# Patient Record
Sex: Male | Born: 1955 | Race: Black or African American | Hispanic: No | Marital: Married | Smoking: Never smoker
Health system: Southern US, Community
[De-identification: ages and names within clinical notes are randomized; demographics above are authoritative.]

## PROBLEM LIST (undated history)

## (undated) DIAGNOSIS — R443 Hallucinations, unspecified: Secondary | ICD-10-CM

## (undated) DIAGNOSIS — F4310 Post-traumatic stress disorder, unspecified: Secondary | ICD-10-CM

## (undated) DIAGNOSIS — E7800 Pure hypercholesterolemia, unspecified: Secondary | ICD-10-CM

## (undated) DIAGNOSIS — I10 Essential (primary) hypertension: Secondary | ICD-10-CM

## (undated) DIAGNOSIS — C801 Malignant (primary) neoplasm, unspecified: Secondary | ICD-10-CM

## (undated) DIAGNOSIS — F32A Depression, unspecified: Secondary | ICD-10-CM

## (undated) DIAGNOSIS — E119 Type 2 diabetes mellitus without complications: Secondary | ICD-10-CM

## (undated) DIAGNOSIS — R45851 Suicidal ideations: Secondary | ICD-10-CM

## (undated) HISTORY — PX: OTHER SURGICAL HISTORY: SHX169

## (undated) HISTORY — PX: COLONOSCOPY: SHX174

## (undated) HISTORY — PX: CORONARY ANGIOPLASTY WITH STENT PLACEMENT: SHX49

## (undated) HISTORY — DX: Depression, unspecified: F32.A

---

## 2001-06-22 ENCOUNTER — Encounter: Payer: Self-pay | Admitting: Emergency Medicine

## 2001-06-22 ENCOUNTER — Inpatient Hospital Stay (HOSPITAL_COMMUNITY): Admission: EM | Admit: 2001-06-22 | Discharge: 2001-06-26 | Payer: Self-pay | Admitting: Emergency Medicine

## 2001-06-22 ENCOUNTER — Encounter (INDEPENDENT_AMBULATORY_CARE_PROVIDER_SITE_OTHER): Payer: Self-pay | Admitting: Specialist

## 2001-07-06 ENCOUNTER — Encounter: Admission: RE | Admit: 2001-07-06 | Discharge: 2001-07-06 | Payer: Self-pay | Admitting: Internal Medicine

## 2001-08-14 ENCOUNTER — Encounter: Admission: RE | Admit: 2001-08-14 | Discharge: 2001-08-14 | Payer: Self-pay

## 2002-02-17 ENCOUNTER — Encounter: Admission: RE | Admit: 2002-02-17 | Discharge: 2002-02-17 | Payer: Self-pay | Admitting: Internal Medicine

## 2002-03-03 ENCOUNTER — Encounter: Admission: RE | Admit: 2002-03-03 | Discharge: 2002-03-03 | Payer: Self-pay | Admitting: Internal Medicine

## 2003-04-15 ENCOUNTER — Emergency Department (HOSPITAL_COMMUNITY): Admission: EM | Admit: 2003-04-15 | Discharge: 2003-04-15 | Payer: Self-pay | Admitting: Emergency Medicine

## 2003-04-18 ENCOUNTER — Emergency Department (HOSPITAL_COMMUNITY): Admission: AD | Admit: 2003-04-18 | Discharge: 2003-04-18 | Payer: Self-pay | Admitting: Family Medicine

## 2003-04-29 ENCOUNTER — Encounter: Admission: RE | Admit: 2003-04-29 | Discharge: 2003-04-29 | Payer: Self-pay | Admitting: Internal Medicine

## 2003-06-24 ENCOUNTER — Ambulatory Visit (HOSPITAL_COMMUNITY): Admission: RE | Admit: 2003-06-24 | Discharge: 2003-06-24 | Payer: Self-pay | Admitting: Internal Medicine

## 2003-06-24 ENCOUNTER — Encounter: Admission: RE | Admit: 2003-06-24 | Discharge: 2003-06-24 | Payer: Self-pay | Admitting: Internal Medicine

## 2004-10-22 ENCOUNTER — Emergency Department (HOSPITAL_COMMUNITY): Admission: EM | Admit: 2004-10-22 | Discharge: 2004-10-22 | Payer: Self-pay | Admitting: Family Medicine

## 2004-11-06 ENCOUNTER — Encounter: Payer: Self-pay | Admitting: Cardiology

## 2004-11-06 ENCOUNTER — Ambulatory Visit: Payer: Self-pay | Admitting: Internal Medicine

## 2004-11-06 ENCOUNTER — Ambulatory Visit: Payer: Self-pay | Admitting: Cardiology

## 2004-11-06 ENCOUNTER — Inpatient Hospital Stay (HOSPITAL_COMMUNITY): Admission: EM | Admit: 2004-11-06 | Discharge: 2004-11-09 | Payer: Self-pay | Admitting: Emergency Medicine

## 2004-11-21 ENCOUNTER — Ambulatory Visit: Payer: Self-pay | Admitting: Internal Medicine

## 2004-12-06 ENCOUNTER — Ambulatory Visit: Payer: Self-pay | Admitting: Internal Medicine

## 2005-04-11 ENCOUNTER — Ambulatory Visit: Payer: Self-pay | Admitting: Internal Medicine

## 2005-04-25 ENCOUNTER — Ambulatory Visit: Payer: Self-pay | Admitting: Internal Medicine

## 2005-05-21 ENCOUNTER — Ambulatory Visit (HOSPITAL_COMMUNITY): Admission: RE | Admit: 2005-05-21 | Discharge: 2005-05-21 | Payer: Self-pay | Admitting: Internal Medicine

## 2005-05-21 ENCOUNTER — Ambulatory Visit: Payer: Self-pay | Admitting: Internal Medicine

## 2005-06-28 ENCOUNTER — Emergency Department (HOSPITAL_COMMUNITY): Admission: EM | Admit: 2005-06-28 | Discharge: 2005-06-28 | Payer: Self-pay | Admitting: *Deleted

## 2005-07-23 ENCOUNTER — Encounter (INDEPENDENT_AMBULATORY_CARE_PROVIDER_SITE_OTHER): Payer: Self-pay | Admitting: *Deleted

## 2005-07-23 ENCOUNTER — Ambulatory Visit: Payer: Self-pay | Admitting: Internal Medicine

## 2005-07-23 DIAGNOSIS — Z87448 Personal history of other diseases of urinary system: Secondary | ICD-10-CM

## 2005-07-23 LAB — CONVERTED CEMR LAB: PSA: 0.67 ng/mL

## 2006-03-27 ENCOUNTER — Emergency Department (HOSPITAL_COMMUNITY): Admission: EM | Admit: 2006-03-27 | Discharge: 2006-03-27 | Payer: Self-pay | Admitting: Family Medicine

## 2006-04-16 ENCOUNTER — Encounter (INDEPENDENT_AMBULATORY_CARE_PROVIDER_SITE_OTHER): Payer: Self-pay | Admitting: *Deleted

## 2006-04-16 DIAGNOSIS — B182 Chronic viral hepatitis C: Secondary | ICD-10-CM | POA: Insufficient documentation

## 2006-04-16 DIAGNOSIS — F1910 Other psychoactive substance abuse, uncomplicated: Secondary | ICD-10-CM

## 2006-04-16 DIAGNOSIS — Z8711 Personal history of peptic ulcer disease: Secondary | ICD-10-CM

## 2006-04-16 DIAGNOSIS — Z8719 Personal history of other diseases of the digestive system: Secondary | ICD-10-CM

## 2006-04-16 DIAGNOSIS — I10 Essential (primary) hypertension: Secondary | ICD-10-CM | POA: Insufficient documentation

## 2006-06-23 DIAGNOSIS — E785 Hyperlipidemia, unspecified: Secondary | ICD-10-CM | POA: Insufficient documentation

## 2006-06-23 DIAGNOSIS — K219 Gastro-esophageal reflux disease without esophagitis: Secondary | ICD-10-CM | POA: Insufficient documentation

## 2006-07-18 ENCOUNTER — Emergency Department (HOSPITAL_COMMUNITY): Admission: EM | Admit: 2006-07-18 | Discharge: 2006-07-18 | Payer: Self-pay | Admitting: Emergency Medicine

## 2006-07-18 ENCOUNTER — Telehealth (INDEPENDENT_AMBULATORY_CARE_PROVIDER_SITE_OTHER): Payer: Self-pay | Admitting: *Deleted

## 2006-08-01 ENCOUNTER — Ambulatory Visit: Payer: Self-pay | Admitting: Internal Medicine

## 2006-08-01 ENCOUNTER — Encounter (INDEPENDENT_AMBULATORY_CARE_PROVIDER_SITE_OTHER): Payer: Self-pay | Admitting: *Deleted

## 2006-08-01 LAB — CONVERTED CEMR LAB
AST: 39 units/L — ABNORMAL HIGH (ref 0–37)
Alkaline Phosphatase: 70 units/L (ref 39–117)
Glucose, Bld: 105 mg/dL — ABNORMAL HIGH (ref 70–99)
INR: 1.2
Sodium: 138 meq/L (ref 135–145)
Total Bilirubin: 0.6 mg/dL (ref 0.3–1.2)
Total Protein: 7.6 g/dL (ref 6.0–8.3)

## 2006-08-08 ENCOUNTER — Ambulatory Visit: Payer: Self-pay | Admitting: Internal Medicine

## 2006-12-16 ENCOUNTER — Emergency Department (HOSPITAL_COMMUNITY): Admission: EM | Admit: 2006-12-16 | Discharge: 2006-12-16 | Payer: Self-pay | Admitting: Family Medicine

## 2006-12-23 ENCOUNTER — Telehealth: Payer: Self-pay | Admitting: *Deleted

## 2007-04-24 ENCOUNTER — Telehealth (INDEPENDENT_AMBULATORY_CARE_PROVIDER_SITE_OTHER): Payer: Self-pay | Admitting: *Deleted

## 2007-09-08 IMAGING — CT CT ANGIO CHEST
2 series · 19 of 32 positions shown · IV contrast (omnipaque)
Comparison: none

CLINICAL DATA: Chest pain left side.  Shortness of breath.  Some leg pain.  
 CT ANGIOGRAPHY OF CHEST:
TECHNIQUE: Multidetector CT imaging of the chest was performed during bolus injection of intravenous contrast.  Multiplanar CT angiographic image reconstructions were generated to evaluate the vascular anatomy.
 Contrast:  100 cc Omnipaque 300.  There was relatively poor timing for pulmonary embolus.  It was therefore reinjected with another 100 milliliters of Omnipaque 300 and on the second scan, there was improved visualization of the pulmonary arteries which showed no evidence of pulmonary embolus.  
 The major vessels appear normal.  Cardiopericardial silhouette is minimally prominent, but no pericardial or pleural effusion is seen.  There appears to be a small hiatal hernia and there is a right apical pleural bleb which measures approximately 1.5 x 2.0 cm.  There is no pneumothorax seen. 
 The bony thorax is within the normal limit.  The gallbladder shows multiple small gallstones.  Common bile duct, hepatic radicles, liver, spleen are normal.  The adrenal glands are normal.  Kidneys are normal with simple cyst measuring up 2.0 cm in diameter associated with the left kidney.

[Series 6: pulm embolism 2.0 b31f st · axial · 0.81mm/px · z∈[-230,+28]mm · 14 of 153 slices shown (1 of 2)]
[im 12/153  lung]
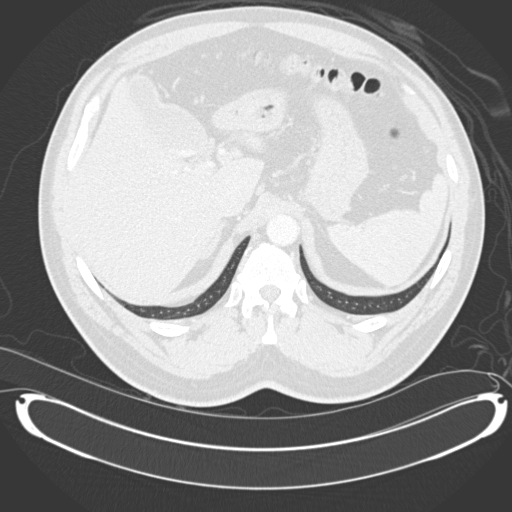
[im 24/153  mediastinal]
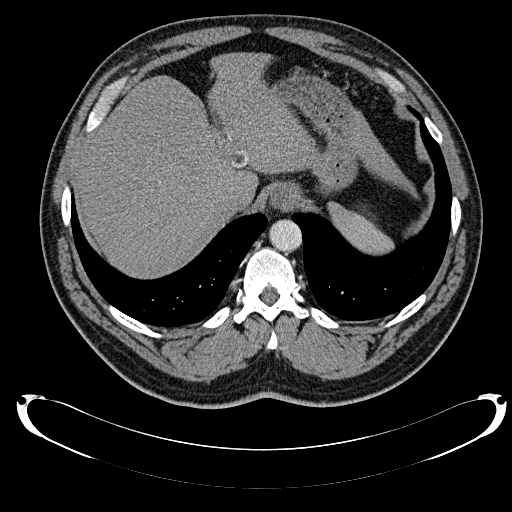
[im 36/153  lung]
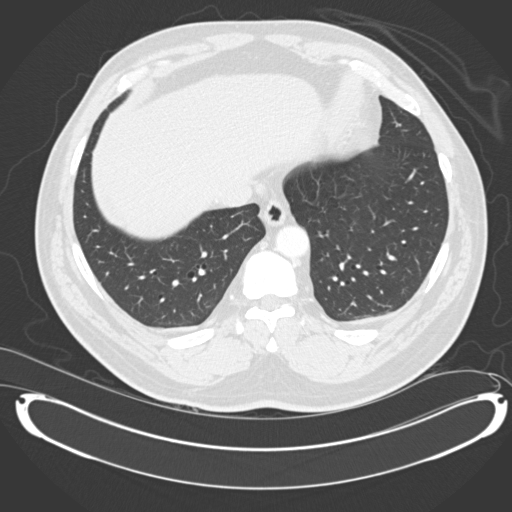
[im 47/153  mediastinal]
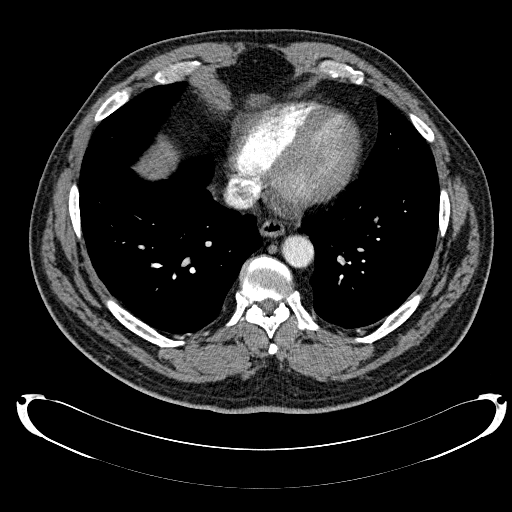
[im 59/153  lung]
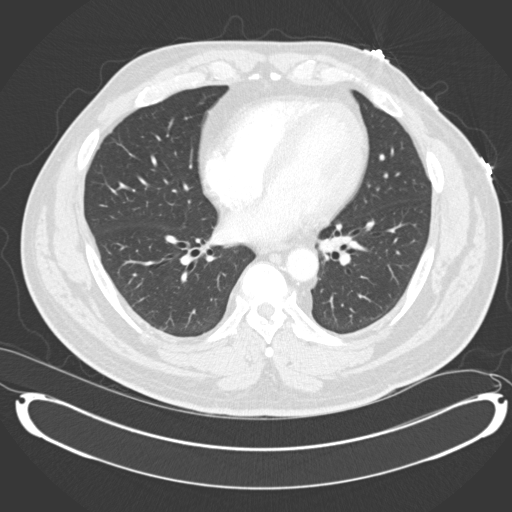
[im 69/153  mediastinal]
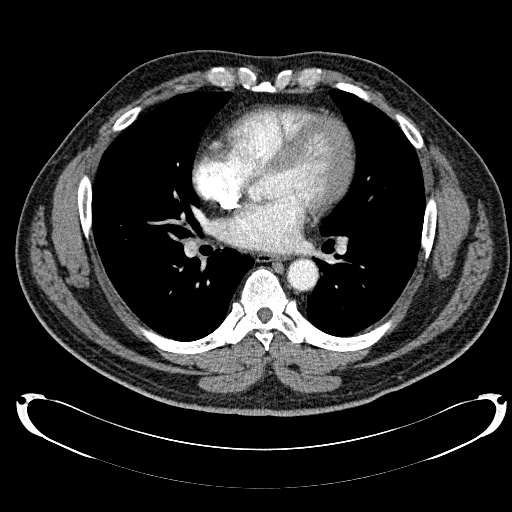
[im 71/153  lung]
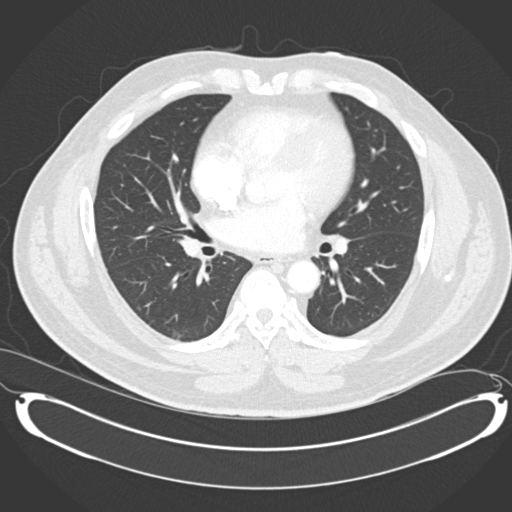
[im 77/153  mediastinal]
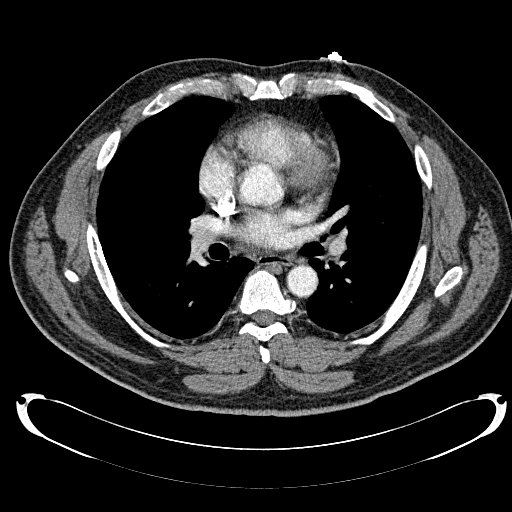
[im 82/153  lung]
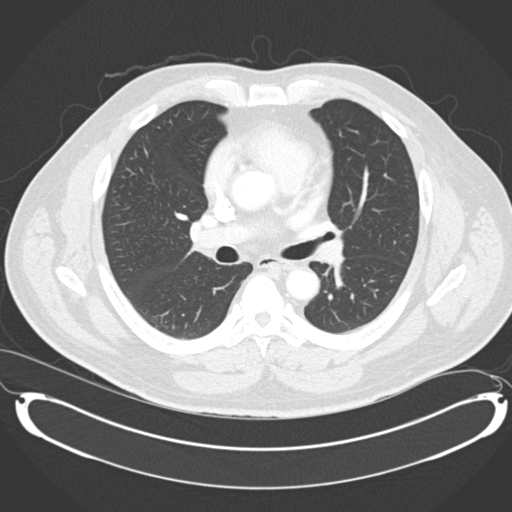
[im 94/153  mediastinal]
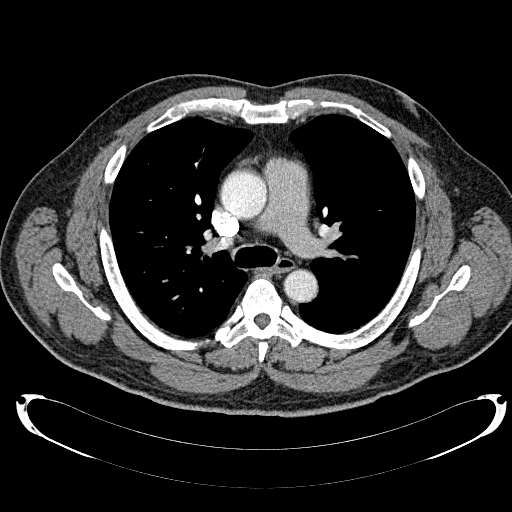
[im 106/153  lung]
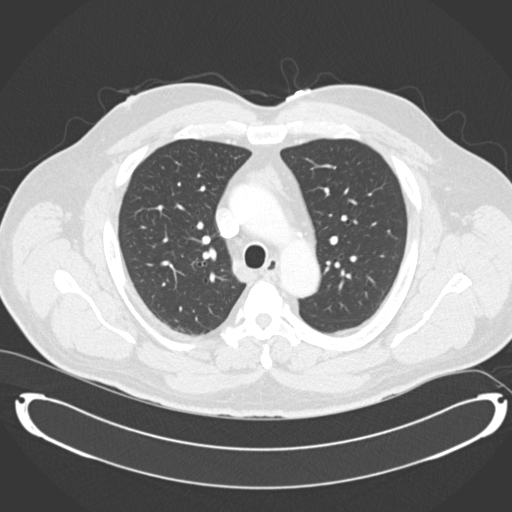
[im 117/153  mediastinal]
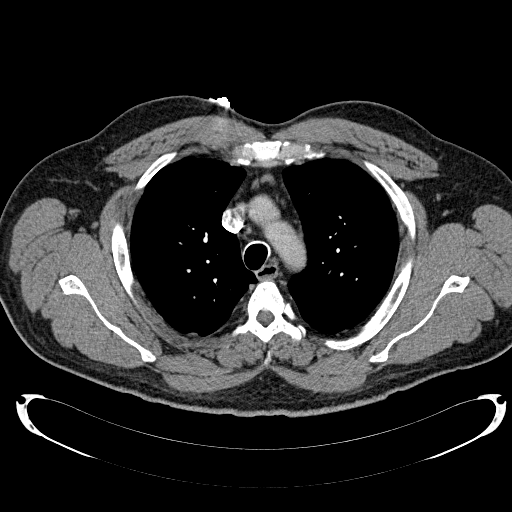
[im 129/153  lung]
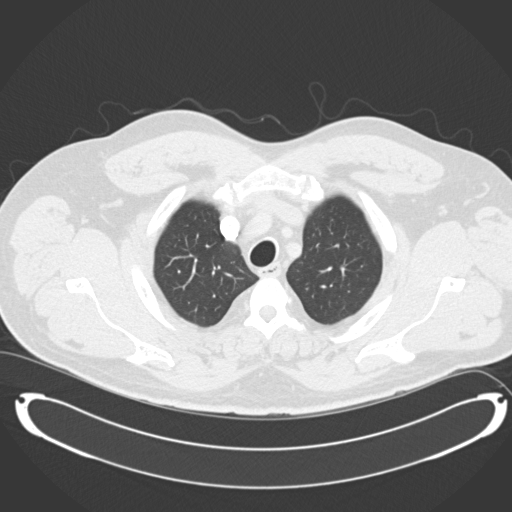
[im 141/153  mediastinal]
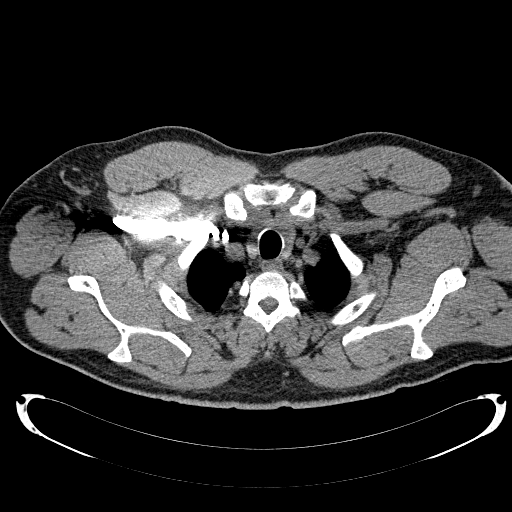

[Series 12: pulm embolism 2.0 b31f st · axial · 0.70mm/px · z∈[-246,-108]mm · 5 of 163 slices shown (2 of 2)]
[im 13/163  lung]
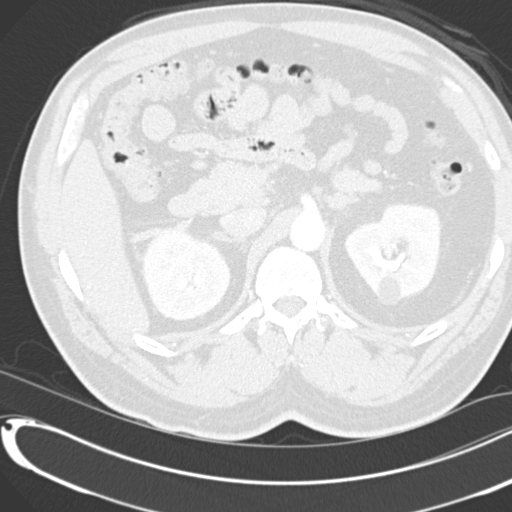
[im 38/163  lung]
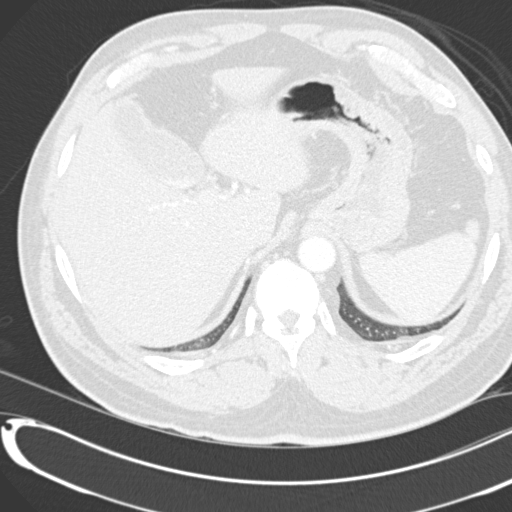
[im 63/163  lung]
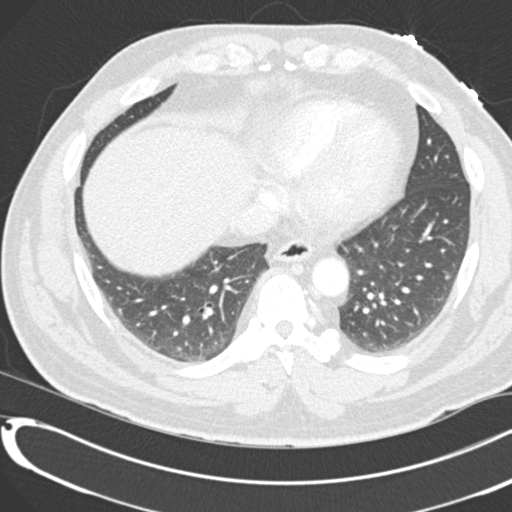
[im 77/163  lung]
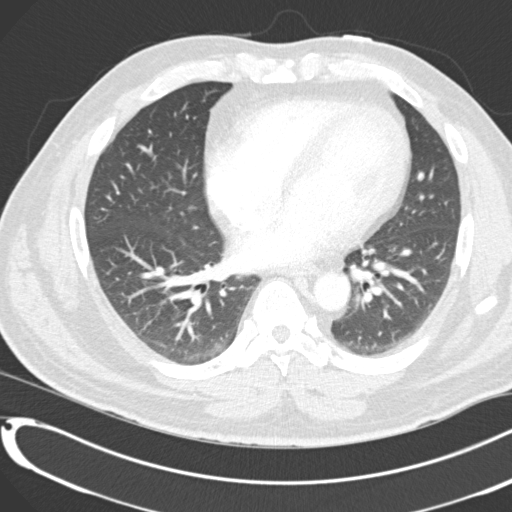
[im 82/163  lung]
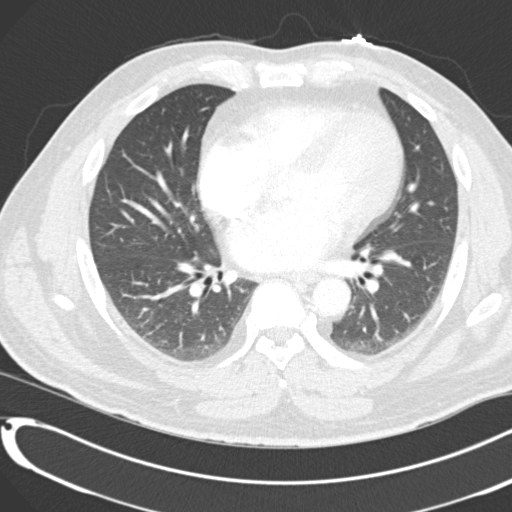

[19 of 32 positions shown; findings below may reference images not displayed]

IMPRESSION: No evidence of pulmonary emboli or acute pulmonary disease.  There is a 1.5 x 2.0 cm right apical pleural bleb, but no pneumothorax or pleural effusion.  No abnormal lymph nodes are seen.  There are no masses present.  Heart is slightly prominent in size with no pericardial effusion.  The gallbladder shows multiple small gallstones.  Simple cyst left kidney.

## 2007-12-07 ENCOUNTER — Telehealth (INDEPENDENT_AMBULATORY_CARE_PROVIDER_SITE_OTHER): Payer: Self-pay | Admitting: *Deleted

## 2010-11-02 NOTE — Consult Note (Signed)
NAMEMETE, PURDUM          ACCOUNT NO.:  [id number]   MEDICAL RECORD NO.:  [id number]          PATIENT TYPE:  INP   LOCATION:  1824                         FACILITY:  [facility]   PHYSICIAN:  Ramiro Harvest, MD    DATE OF BIRTH:  Jul 27, 1955   DATE OF CONSULTATION:  DATE OF DISCHARGE:                                   CONSULTATION   ADDENDUM:  job [phone]   ALLERGIES:  Patient has no known drug allergies.  Patient has no allergies  to contrast.  Patient has no allergies to shellfish.      DT/MEDQ  D:  11/06/2004  T:  11/06/2004  Job:  [id number]

## 2010-11-02 NOTE — Discharge Summary (Signed)
[name], [name]          ACCOUNT NO.:  [id number]   MEDICAL RECORD NO.:  [id number]          PATIENT TYPE:  INP   LOCATION:  3728                         FACILITY:  [facility]   PHYSICIAN:  Darrol Jump, MD        DATE OF BIRTH:  1955-07-17   DATE OF ADMISSION:  11/06/2004  DATE OF DISCHARGE:  11/09/2004                                 DISCHARGE SUMMARY   DISCHARGE DIAGNOSES:  1.  Nonischemic chest pain.  2.  Hyperlipidemia.  3.  Hepatitis C.  4.  History of polysubstance abuse.  5.  Hypertension.  6.  History of cocaine abuse.  7.  History of tobacco abuse.  8.  History of ethanol abuse.  9.  History of viral esophagitis.  10. History of H.pylori infection 2003 with EGD in 2003.   DISCHARGE MEDICATIONS:  1.  Aspirin 81 mg daily.  2.  Lisinopril 10 mg daily.  3.  Hydrochlorothiazide 25 mg daily.   DISPOSITION:  The patient is to be discharged home and is to follow up in  the [facility] outpatient clinic on June 7, at 2:30 p.m.  At that time, he  will need a B-MET.   CONSULTATIONS:  Consultations during this hospitalization:  Vida Roller,  M.D., [facility].   BRIEF ADMISSION HISTORY AND PHYSICAL:  [name] is a 55 year old  African American male with past medical history significant for hypertension  and polysubstance abuse.  He describes substernal chest pain with some left  handed numbness and dyspnea, positive nausea but no vomiting.  This started  at 4 a.m. when he was standing up from [facility] at the ministry.  The patient  says that the pain and pressure worsened and lasted about 20-30 minutes upon  standing.  Pain was relieved with administration of nitroglycerin.  Also,  complains of right ankle edema which is not new.  Also, has a history of  polysubstance abuse and quit using cocaine in December 2005.   PHYSICAL EXAMINATION:  VITAL SIGNS:  Pulse 81, blood pressure 198/107,  repeat 152/96.  He had equal pressures in both arms.  Temperature  97.7,  respirations 20, O2 saturation 100% on room air.  GENERAL APPEARANCE:  This is an overweight African American male who  appeared in no acute distress  HEENT:  Eyes were PERRLA.  Extraocular movements intact with three beat  horizontal nystagmus.  Oropharynx was clear.  No erythema.  No exudate.  Good dentition.  NECK:  Supple.  No JVD.  LUNGS:  Respirations were clear to auscultation bilaterally.  CARDIOVASCULAR:  Regular rate and rhythm without murmur.  GI:  Mild epigastric tenderness.  ABDOMEN:  Soft, nondistended, positive bowel sounds.  EXTREMITIES:  Some minimal right ankle edema, 2+ pedal pulses.  MUSCULOSKELETAL:  Grossly intact.  NEUROLOGICAL:  No focal deficits.   LABORATORY DATA:  Sodium 139, potassium 4.1, chloride 106, bicarbonate 28,  BUN 13, creatinine 0.8, glucose 98, bilirubin 0.7, alk-phos 82, SGOT 40,  SGPT 55.  White count 5.9, hemoglobin 15.3, platelets 239,0000.  He had  three sets of cardiac enzymes which were negative.  D. dimer was  less than  0.22.  Urine drug screen was positive for opiates which were administered in  ED.  Coags were normal.  EKG was normal with no signs of ischemia.  Chest x-  ray showed cardiomegaly.   HOSPITAL COURSE:  #1 - CHEST PAIN.  Differential diagnosis including acute  coronary syndrome, __________ spasm.  The patient was admitted to a  telemetry bed for observation.  PE was ruled out by D dimer.  Dissection was  ruled out by no widened mediastinum on chest x-ray, equal pressures in both  arms.   #2 - ACUTE CORONARY SYNDROME.  The patient was started on nitroglycerin  drip, was given morphine, started on Metoprolol, given full dose of aspirin.  Was not started on any heparin.  Cardiology consult was obtained.  The  patient had three sets of negative cardiac enzymes.  However, his a.m. EKG  showed a change in T waves that was not noted on admission.  So, the patient  proceed with cardiac catheterization where he was found to  have  nonobstructive coronary artery disease.  Fasting lipid profile was checked.  The patient had a total cholesterol of 195, triglyceride 96, HDL 38, LDL  138.  However, Lipitor was not started at this time or any other Statin  because of the patient's elevated liver transaminases.   #3 - HYPERTENSION.  The patient will be discharged on hydrochlorothiazide 25  mg daily, as well as, 10 mg Lisinopril a day.  He will need a B-MET when  coming back to the clinic to assess potassium and creatinine, as well as,  institution of his blood pressure medications.   #4 - ELEVATED LIVER TRANSAMINASES.  With the patient's history of IV drug  use, hepatitis serologies were drawn.  Hepatitis B serologies were negative.  However, hepatitis C antibody was positive.  The patient will likely need  referral as an outpatient in our clinic for that, hepatitis C clinic.  The  patient deferred HIV testing at this time.  However, he is at risk with his  history of IV drug use and should probably be investing in this clinic.   #5 - SLIGHTLY ELEVATED AMYLASE.  The patient was felt likely to have a  slightly elevated amylase secondary to mild pancreatitis for history of  ethanol abuse in the past.  The patient is tolerating p.o.'s well.  Will  monitor this in the future.   PROCEDURES:  1.  Cardiac catheterization which revealed nonobstructive coronary artery      disease, normal LV function.  2.  A 2D echo which demonstrated overall LV systolic functions at the lower      limits of normal.  EF 50-55%, and study was inadequate for evaluating      regional wall abnormalities.  There was mild mitral valve regurgitation,      and the left atrium was mildly dilated.      SD/MEDQ  D:  11/09/2004  T:  11/09/2004  Job:  [id number]

## 2010-11-02 NOTE — Cardiovascular Report (Signed)
NAMEMOUSA, PROUT          ACCOUNT NO.:  [id number]   MEDICAL RECORD NO.:  [id number]          PATIENT TYPE:  INP   LOCATION:  3728                         FACILITY:  [facility]   PHYSICIAN:  Rollene Rotunda, M.D.   DATE OF BIRTH:  [birth year]   DATE OF PROCEDURE:  11/08/2004  DATE OF DISCHARGE:                              CARDIAC CATHETERIZATION   REQUESTING PHYSICIAN:  Dr. Trula Ore Rama.   PROCEDURE:  Left heart catheterization/coronary arteriography.   INDICATION:  Evaluate the patient with chest pain and cardiovascular risk  factors.   PROCEDURAL NOTE:  Left heart catheterization was performed via the right  femoral artery.  The artery was cannulated using an anterior wall puncture.  A #6-French arterial sheath was inserted via the modified Seldinger  technique.  A preformed Judkins and a pigtail catheter were utilized.  The  patient tolerated the procedure well and left the lab in stable condition.   RESULTS:   HEMODYNAMICS:  LV 184/24, AO 200/120.   CORONARIES:  The left main was normal.   The LAD had a mid long 25% stenosis.   The circumflex in the AV groove was normal.  There was a very large ramus  intermedius which was normal.  There was a mid obtuse marginal which was  large and normal.   The right coronary artery is a dominant vessel.  There was a proximal 25%  stenosis.  PDA was moderate-sized and normal.  Posterolaterals were small x2  and normal.   LEFT VENTRICULOGRAM:  The left ventriculogram was obtained in the RAO  projection.  The EF was 65% with normal wall motion.   CONCLUSION:  1.  Minimal coronary plaque.  2.  Normal left ventricular function.   PLAN:  No further cardiac workup is suggested.  The patient will have  primary risk reduction and blood pressure control.      JH/MEDQ  D:  11/08/2004  T:  11/09/2004  Job:  [id number]
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
## 2010-11-02 NOTE — Discharge Summary (Signed)
[place]  Patient:    [name], [name] Visit Number: [id number] MRN: [id number]          Service Type: MED Location: [id number] Attending Physician:  Alfonso Ramus Dictated by:   Kerrie Pleasure, M.D. Admit Date:  06/22/2001 Discharge Date: 06/26/2001   CC:         [facility], Dr. Melvia Heaps   Discharge Summary  DISCHARGE DIAGNOSES: 1. Viral enteritis. 2. Hematemesis. 3. Hypertension. 4. Helicobacter pylori infection. 5. Viral esophagitis.  DISCHARGE MEDICATIONS: 1. Pred pack one b.i.d. p.o. x14 days. 2. K-Dur 20 mEq p.o. q.d. x3 days. 3. Hydrochlorothiazide 12.5 mg p.o. q.d.  PROCEDURES PERFORMED:  Esophagogastroduodenoscopy done by Dr. Melvia Heaps of [facility].  Esophagogastroduodenoscopy was performed on 06/22/01, it shows ______ inflammation consistent with esophagitis.  Also shows mucosal abnormality with erythematous mucosa without hemorrhage.  DISPOSITION:  The patient was discharged in good health, and was asked to follow up with Dr. Mikeal Hawthorne at [facility] on July 06, 2001, at 2:30 p.m.  HISTORY OF PRESENT ILLNESS:  The patient is a 55 year old African-American male.  He is a resident of [facility].  He has no prior medical history. He presented with acute history of vomiting of blood which started the night before admission.  Prior to that, he has had watery diarrhea for about 10 days, together with his ______ [facility], which he initially thought was Giardia.  His diarrhea gradually resolved, but after resolving, he started eating again.  After supper, he started having problems with his abdomen, and by 3 a.m. he threw up.  Had nausea and vomiting.  He estimated that he vomited approximately 2 cups of blood.  He had no fever, no diarrhea at the time of presentation.  Did have some ______, but otherwise no history of NSAID use, no history of alcohol abuse.  The patient has had  a past history of drug abuse and alcohol abuse, which is why he checked into [facility].  He was dry for about a month prior to this presentation.  PHYSICAL EXAMINATION:  VITAL SIGNS:  Temperature 99.6, blood pressure 161/94, pulse 87, respiratory rate 20, saturations 97% on room air.  His orthostatics when he came in was 153/91 supine, 163/105 standing, with a pulse of 92 standing and 90 sitting. Not overtly orthostatic.  ABDOMEN:  Mild epigastric tenderness with some guarding.  RECTAL:  Guaiac positive.  ADMISSION LABORATORY DATA:  White count 5, hemoglobin 14.5, platelets 237. Sodium 140, potassium 3.6, chloride 108, CO2 25, BUN 11, creatinine 1, glucose 87, calcium 8.2.  Alkaline phosphatase 67, AST 35, ALT 50, total protein 6.7, albumin 3.1, total bilirubin 0.4.  PTT 33, PT 15.4, INR 1.3.  Lantus was 20. Alcohol level less than 5.  Urine drug screen was negative.  UA shows yellow urine with specific gravity 1.039, was positive for bilirubin which is small, and positive for ketones of 15.  HOSPITAL COURSE:  #1 - GASTROINTESTINAL BLEED:  The patient was immediately resuscitated in the emergency room.  Two large bore IV lines were started.  The patient was given initial bolus of 1 L of normal saline, followed by maintenance of 100 cc an hour for the first 3 days of his admission.  GI was consulted, and Dr. Arlyce Dice scoped the patient on the same day, where two ulcerations were noted, one in the esophagus and another inflammation was noted in his duodenal bulb. Subsequently, biopsies were taken  and taken for viral culture.  The patient was therefore started on Protonix while in the hospital with some pain control, as well as placed on n.p.o. until his nausea and vomiting abated.  He was given some Phenergan to relieve his vomiting as well.  During the course of his hospitalization the patient did well and was discharged in good health.  #2 - HYPERTENSION:  The patient was not  hypertensive, but has risk factors with his family history of diabetes and hypertension.  The patient was monitored during hospitalization.  He was initially not treated for hypertension because of his need of resuscitation, he was taking fluids at that point.  However, after the patient was truly resuscitated prior to discharge, he was started on hydrochlorothiazide 12.5 mg q.d.  The patient was started on that medication.  #3 - HELICOBACTER PYLORI INFECTION:  As mentioned, the patient was tested also for Helicobacter, and it turned out positive.  His serology came back as positive for H. pylori.  The patient was therefore placed on H. pylori treatment.  At the time of discharge he was started on Pred-Pack for treatment of H. pylori.  #4 - DIARRHEA:  On the fifth day of hospitalization the patients nausea and vomiting abated.  As such, the patient started taking p.o. meals.  After taking his supper that day, his diarrhea returned, now that he is resuscitated and taking p.o.  The patient had it 12 to 13 times.  As such, the patient was maintained on IV Fleets and Questran was also given to the patient at that point.  He maintained a positive fluid balance throughout the course of that incident.  Along the lines, Questran was also discontinued and Lomotil was also given to the patient, and his diarrhea stopped a day before discharge. At the time of discharge, the patient was in good health.  DISCHARGE LABORATORY DATA:  On the day of discharge the patient has a white count of 4.9, hemoglobin 13.8, platelets 262.  Sodium 139, potassium 3.4, chloride 109, CO2 25, BUN 4, creatinine 0.8, glucose 94, calcium 8.5.  He also has negative fecal white blood cells, and H. pylori IgG was positive at the level of 3.2.Dictated by:   Kerrie Pleasure, M.D. Attending Physician:  Alfonso Ramus DD:  07/06/01 TD:  07/07/01 Job: [id number]

## 2014-07-13 ENCOUNTER — Encounter: Payer: Self-pay | Admitting: Internal Medicine

## 2014-07-13 ENCOUNTER — Ambulatory Visit (INDEPENDENT_AMBULATORY_CARE_PROVIDER_SITE_OTHER): Payer: Non-veteran care | Admitting: Internal Medicine

## 2014-07-13 VITALS — BP 150/91 | HR 83 | Temp 98.4°F | Ht 73.0 in | Wt 245.0 lb

## 2014-07-13 DIAGNOSIS — B182 Chronic viral hepatitis C: Secondary | ICD-10-CM

## 2014-07-13 MED ORDER — RIBAVIRIN 200 MG PO TABS: 1200.0000 mg/d | ORAL_TABLET | Freq: Two times a day (BID) | ORAL | Status: DC

## 2014-07-13 MED ORDER — SOFOSBUVIR 400 MG PO TABS: 1.0000 | ORAL_TABLET | Freq: Every day | ORAL | Status: DC

## 2014-07-13 NOTE — Progress Notes (Signed)
+[name] is a 59 y.o. male who presents for initial evaluation and management of a positive Hepatitis C antibody test.  Patient tested positive several years ago. Hepatitis C risk factors present are: past drug use. Patient denies history of blood transfusion, multiple sexual partners, sexual contact with person with liver disease, tattoos. Patient has had other studies performed. Results: hepatitis C RNA by PCR, result: positive. Patient has not had prior treatment for Hepatitis C. Patient does not have a past history of liver disease. Patient does not have a family history of liver disease.   HPI: He has a history of alcohol use but none for over 1 year.  Remote drugs 15 years ago.  He has been evaluated by [facility] and has genotype 2B, viral load of 6.18 million, F0-F1 on Fibroscan.  He is here for treatment through the [place] 'choice program' since there is no availability at the [place].  Was seen by Dr. Benjie Karvonen Dr. Gena Fray at [facility] [place] who did work up.     Started hep B series at [place].   Record from the [place] reviewed.   Patient does have documented immunity to Hepatitis A. Patient does not have documented immunity to Hepatitis B.     Review of Systems A comprehensive review of systems was negative.   No past medical history on file.  Prior to Admission medications   Medication Sig Start Date End Date Taking? Authorizing Provider  ribavirin (COPEGUS) 200 MG tablet Take 3 tablets (600 mg total) by mouth 2 (two) times daily. 07/13/14   Thayer Headings, MD  Sofosbuvir 400 MG TABS Take 1 tablet by mouth daily. 07/13/14   Thayer Headings, MD    No Known Allergies  History  Substance Use Topics  . Smoking status: Never Smoker   . Smokeless tobacco: Not on file  . Alcohol Use: 2.4 oz/week    4 Cans of beer per week    No family history on file.    Objective:   Filed Vitals:   07/13/14 1506  BP: 150/91  Pulse: 83  Temp: 98.4 F (36.9 C)   in no apparent distress and alert HEENT:  anicteric Cor RRR and No murmurs clear Bowel sounds are normal, liver is not enlarged, spleen is not enlarged peripheral pulses normal, no pedal edema, no clubbing or cyanosis negative for - jaundice, spider hemangioma, telangiectasia, palmar erythema, ecchymosis and atrophy  Laboratory Genotype: No results found for: HCVGENOTYPE HCV viral load:  No results found for: WBC, HGB, HCT, MCV, PLT  Lab Results  Component Value Date   CREATININE 1.08 08/01/2006   BUN 15 08/01/2006   NA 138 08/01/2006   K 4.5 08/01/2006   CL 103 08/01/2006   CO2 25 08/01/2006    Lab Results  Component Value Date   ALT 65* 08/01/2006   AST 39* 08/01/2006   ALKPHOS 70 08/01/2006   BILITOT 0.6 08/01/2006     Assessment: Chronic Hepatitis C genotype 2b  Plan: 1) Patient counseled extensively on limiting acetaminophen to no more than 2 grams daily, avoidance of alcohol. 2) Transmission discussed with patient including sexual transmission, sharing razors and toothbrush.   3) Will need referral to gastroenterology - no 4) Will need referral for substance abuse counseling: No. 5) Will prescribe sofosbuvir and weight based ribavirin (600 mg bid) for 12 weeks.  Counseled the patient on side effects and monitoring.  Baseline Hgb from 05/21/14 is 14.9.   6) Follow up 2 weeks after starting  medication with the pharmacist.  7) he gets labs from the [facility] [place] at 4 weeks and I will see him in 5 weeks.  Labs are to be sent by [facility] to me.

## 2014-07-27 ENCOUNTER — Ambulatory Visit: Payer: Non-veteran care

## 2014-08-02 ENCOUNTER — Telehealth: Payer: Self-pay | Admitting: *Deleted

## 2014-08-02 NOTE — Telephone Encounter (Signed)
error 

## 2014-08-16 ENCOUNTER — Ambulatory Visit: Payer: Non-veteran care | Admitting: Internal Medicine

## 2014-08-16 ENCOUNTER — Telehealth: Payer: Self-pay | Admitting: Licensed Clinical Social Worker

## 2014-08-16 ENCOUNTER — Telehealth: Payer: Self-pay | Admitting: *Deleted

## 2014-08-16 NOTE — Telephone Encounter (Signed)
Patient did not show for Hep C follow up today, Dr. Linus Salmons wanted to know if he was able to get the medications from the [place], and if he did he still needs to follow up here for labs. If he didn't then he needs to speak with Onnie Boer our pharmacist for assistance. I asked the patient to return our call.

## 2014-08-16 NOTE — Telephone Encounter (Signed)
Patient called to let Dr. Linus Salmons know that the [facility] has notified him they will be assuming his hepatitis C care.  He thanks Dr. Linus Salmons for getting treatment started. Landis Gandy, RN

## 2015-01-06 ENCOUNTER — Encounter: Payer: Self-pay | Admitting: Gastroenterology

## 2016-10-16 ENCOUNTER — Encounter (HOSPITAL_COMMUNITY): Payer: Self-pay

## 2016-10-16 DIAGNOSIS — Z7982 Long term (current) use of aspirin: Secondary | ICD-10-CM | POA: Insufficient documentation

## 2016-10-16 DIAGNOSIS — Z79899 Other long term (current) drug therapy: Secondary | ICD-10-CM | POA: Insufficient documentation

## 2016-10-16 DIAGNOSIS — R45851 Suicidal ideations: Secondary | ICD-10-CM | POA: Diagnosis not present

## 2016-10-16 DIAGNOSIS — F1410 Cocaine abuse, uncomplicated: Secondary | ICD-10-CM | POA: Diagnosis present

## 2016-10-16 DIAGNOSIS — I10 Essential (primary) hypertension: Secondary | ICD-10-CM | POA: Insufficient documentation

## 2016-10-16 LAB — COMPREHENSIVE METABOLIC PANEL
ALT: 20 U/L (ref 17–63)
AST: 35 U/L (ref 15–41)
Albumin: 4.3 g/dL (ref 3.5–5.0)
Alkaline Phosphatase: 61 U/L (ref 38–126)
Anion gap: 13 (ref 5–15)
BUN: 10 mg/dL (ref 6–20)
CHLORIDE: 99 mmol/L — AB (ref 101–111)
CO2: 21 mmol/L — ABNORMAL LOW (ref 22–32)
CREATININE: 1.12 mg/dL (ref 0.61–1.24)
Calcium: 9.3 mg/dL (ref 8.9–10.3)
Glucose, Bld: 147 mg/dL — ABNORMAL HIGH (ref 65–99)
Potassium: 3.4 mmol/L — ABNORMAL LOW (ref 3.5–5.1)
Sodium: 133 mmol/L — ABNORMAL LOW (ref 135–145)
TOTAL PROTEIN: 7.3 g/dL (ref 6.5–8.1)
Total Bilirubin: 0.9 mg/dL (ref 0.3–1.2)

## 2016-10-16 LAB — CBC
HCT: 43.3 % (ref 39.0–52.0)
Hemoglobin: 14.7 g/dL (ref 13.0–17.0)
MCH: 30.1 pg (ref 26.0–34.0)
MCHC: 33.9 g/dL (ref 30.0–36.0)
MCV: 88.5 fL (ref 78.0–100.0)
Platelets: 240 10*3/uL (ref 150–400)
RBC: 4.89 MIL/uL (ref 4.22–5.81)
RDW: 14.7 % (ref 11.5–15.5)
WBC: 6.5 10*3/uL (ref 4.0–10.5)

## 2016-10-16 LAB — RAPID URINE DRUG SCREEN, HOSP PERFORMED
AMPHETAMINES: NOT DETECTED
Barbiturates: NOT DETECTED
Benzodiazepines: NOT DETECTED
Cocaine: POSITIVE — AB
OPIATES: NOT DETECTED
Tetrahydrocannabinol: NOT DETECTED

## 2016-10-16 LAB — ETHANOL

## 2016-10-16 LAB — SALICYLATE LEVEL

## 2016-10-16 LAB — ACETAMINOPHEN LEVEL: Acetaminophen (Tylenol), Serum: <10 ug/mL — ABNORMAL LOW (ref 10–30)

## 2016-10-16 NOTE — ED Triage Notes (Signed)
Pt arrives at the Ed with SI and auditory and visual hallucinations; Pt states hx of PTSD and and sees counselor; pt states he sees dead buddy from service and smelling gases from the war; pt states he drove around all day and tried to OD on drugs, pt states he could not find his gun today; pt states he has a wife at home and is ok with staff giving out info on his care; Pt presents very anxious and appears to be upset; Pt is non aggressive. Pt has been placed and scrubs and being wand by security

## 2016-10-17 ENCOUNTER — Emergency Department (HOSPITAL_COMMUNITY)
Admission: EM | Admit: 2016-10-17 | Discharge: 2016-10-22 | Disposition: A | Discharge status: Other Acute Inpatient Hospital | Payer: Medicare HMO | Attending: Emergency Medicine | Admitting: Emergency Medicine

## 2016-10-17 DIAGNOSIS — R45851 Suicidal ideations: Secondary | ICD-10-CM

## 2016-10-17 DIAGNOSIS — F1410 Cocaine abuse, uncomplicated: Secondary | ICD-10-CM

## 2016-10-17 HISTORY — DX: Malignant (primary) neoplasm, unspecified: C80.1

## 2016-10-17 HISTORY — DX: Type 2 diabetes mellitus without complications: E11.9

## 2016-10-17 HISTORY — DX: Pure hypercholesterolemia, unspecified: E78.00

## 2016-10-17 HISTORY — DX: Post-traumatic stress disorder, unspecified: F43.10

## 2016-10-17 HISTORY — DX: Suicidal ideations: R45.851

## 2016-10-17 HISTORY — DX: Essential (primary) hypertension: I10

## 2016-10-17 HISTORY — DX: Hallucinations, unspecified: R44.3

## 2016-10-17 MED ORDER — NICOTINE 21 MG/24HR TD PT24: 21.0000 mg | MEDICATED_PATCH | Freq: Every day | TRANSDERMAL | Status: DC | PRN

## 2016-10-17 MED ORDER — ETODOLAC 500 MG PO TABS: 500.0000 mg | ORAL_TABLET | Freq: Two times a day (BID) | ORAL | Status: DC | PRN

## 2016-10-17 MED ORDER — RIBAVIRIN 200 MG PO TABS: 1200.0000 mg/d | ORAL_TABLET | Freq: Two times a day (BID) | ORAL | Status: DC

## 2016-10-17 MED ORDER — ASPIRIN EC 81 MG PO TBEC
81.0000 mg | DELAYED_RELEASE_TABLET | Freq: Every day | ORAL | Status: DC
  Administered 2016-10-17 – 2016-10-22 (×6): 81 mg via ORAL
  Filled 2016-10-17 (×6): qty 1

## 2016-10-17 MED ORDER — AMLODIPINE BESYLATE 5 MG PO TABS
10.0000 mg | ORAL_TABLET | Freq: Every day | ORAL | Status: DC
  Administered 2016-10-17 – 2016-10-22 (×6): 10 mg via ORAL
  Filled 2016-10-17 (×6): qty 2

## 2016-10-17 MED ORDER — TRAZODONE HCL 100 MG PO TABS
200.0000 mg | ORAL_TABLET | Freq: Every day | ORAL | Status: DC
  Administered 2016-10-17 – 2016-10-21 (×5): 200 mg via ORAL
  Filled 2016-10-17 (×5): qty 2

## 2016-10-17 MED ORDER — ZOLPIDEM TARTRATE 5 MG PO TABS
5.0000 mg | ORAL_TABLET | Freq: Every evening | ORAL | Status: DC | PRN
  Administered 2016-10-17 – 2016-10-19 (×3): 5 mg via ORAL
  Filled 2016-10-17 (×3): qty 1

## 2016-10-17 MED ORDER — LOPERAMIDE HCL 2 MG PO CAPS
4.0000 mg | ORAL_CAPSULE | ORAL | Status: DC | PRN
  Administered 2016-10-17 – 2016-10-21 (×3): 4 mg via ORAL
  Filled 2016-10-17 (×3): qty 2

## 2016-10-17 MED ORDER — SOFOSBUVIR 400 MG PO TABS: 1.0000 | ORAL_TABLET | Freq: Every day | ORAL | Status: DC

## 2016-10-17 MED ORDER — LOSARTAN POTASSIUM 50 MG PO TABS
100.0000 mg | ORAL_TABLET | Freq: Every day | ORAL | Status: DC
  Administered 2016-10-17 – 2016-10-22 (×6): 100 mg via ORAL
  Filled 2016-10-17 (×7): qty 2

## 2016-10-17 MED ORDER — CHOLESTYRAMINE 4 G PO PACK
4.0000 g | PACK | Freq: Every day | ORAL | Status: DC
  Administered 2016-10-18 – 2016-10-22 (×5): 4 g via ORAL
  Filled 2016-10-17 (×7): qty 1

## 2016-10-17 MED ORDER — IBUPROFEN 400 MG PO TABS
600.0000 mg | ORAL_TABLET | Freq: Three times a day (TID) | ORAL | Status: DC | PRN
  Administered 2016-10-18: 600 mg via ORAL
  Filled 2016-10-17: qty 1

## 2016-10-17 MED ORDER — ONDANSETRON HCL 4 MG PO TABS: 4.0000 mg | ORAL_TABLET | Freq: Three times a day (TID) | ORAL | Status: DC | PRN

## 2016-10-17 MED ORDER — FERROUS FUMARATE 324 (106 FE) MG PO TABS
1.0000 | ORAL_TABLET | Freq: Two times a day (BID) | ORAL | Status: DC
  Administered 2016-10-17 – 2016-10-22 (×11): 106 mg via ORAL
  Filled 2016-10-17 (×14): qty 1

## 2016-10-17 MED ORDER — ALUM & MAG HYDROXIDE-SIMETH 200-200-20 MG/5ML PO SUSP
30.0000 mL | ORAL | Status: DC | PRN
  Administered 2016-10-19 – 2016-10-20 (×2): 30 mL via ORAL
  Filled 2016-10-17 (×2): qty 30

## 2016-10-17 MED ORDER — ACETAMINOPHEN 325 MG PO TABS: 650.0000 mg | ORAL_TABLET | ORAL | Status: DC | PRN

## 2016-10-17 NOTE — ED Notes (Addendum)
Pt wife at bedside. Advised that she has a 30 minute visitation with pt.

## 2016-10-17 NOTE — ED Notes (Signed)
Ordered pt lunch.

## 2016-10-17 NOTE — ED Notes (Addendum)
Pt is alert and oriented x 3 but is very slow to respond to questions. Speaks clearly and at normal pace. when he does answer. States he does not know if he is still suicidal.

## 2016-10-17 NOTE — ED Notes (Signed)
Pharmacy called for patients daily meds.

## 2016-10-17 NOTE — ED Provider Notes (Signed)
[facility] DEPT Provider Note   CSN: [id number] Arrival date & time: 10/16/16  2232   By signing my name below, I, Charolotte Ingram, attest that this documentation has been prepared under the direction and in the presence of Daleen Bo, MD. Electronically Signed: Charolotte Ingram, Scribe. 10/17/16. 3:50 AM.    History   Chief Complaint Chief Complaint  Patient presents with  . Suicidal    HPI HPI Comments: [name] is a 62 y.o. male with h/o PTSD who presents to the Emergency Department complaining of suicidal ideations that began 3 days ago. Pt has psychologist at [facility] [place] in [place]. Pt has associated flash backs, nightmares, auditory, visual hallucinations. Pt states that he had a plan and attempted suicide by smoking 1/4 oz of cocaine. Pt states that he has a pistol but he cannot find it. He states that he wakes up and smells white phosphorus. Pt states that he is living with his wife, but that he has not seen her for the last 3 days. Pt states that he has important documents in his wallet that his wife need. Pt has not had his medications for the last 3 days. Pt is not taking his medications due to apathy. Pt states that he does not know what the ED can do for him, due to the fact that he has trust issues. Pt states that he is willing to get back on his medications. Pt is nonaggressive.  Pt has h/o of colorectal CA and takes loparamide to control diarrhea. Dr Audrea Muscat at [facility] did colon/rectal surgery. Pt states that he has h/o Hepatitis C. Pt is a nonsmoker.   The history is provided by the patient. No language interpreter was used.    Past Medical History:  Diagnosis Date  . Hallucinations   . PTSD (post-traumatic stress disorder)   . Suicidal ideation     Patient Active Problem List   Diagnosis Date Noted  . HYPERLIPIDEMIA 06/23/2006  . GERD 06/23/2006  . Chronic hepatitis C without hepatic coma ([place]) 04/16/2006  . SUBSTANCE ABUSE, MULTIPLE 04/16/2006  .  HYPERTENSION 04/16/2006  . HELICOBACTER PYLORI INFECTION, HX OF 04/16/2006  . GASTROINTESTINAL HEMORRHAGE, HX OF 04/16/2006  . HEMATURIA, HX OF 07/23/2005    No past surgical history on file.     Home Medications    Prior to Admission medications   Medication Sig Start Date End Date Taking? Authorizing Provider  amLODipine (NORVASC) 10 MG tablet Take 10 mg by mouth daily.    Historical Provider, MD  aspirin EC 81 MG tablet Take 81 mg by mouth daily.    Historical Provider, MD  Cholestyramine POWD Take 1 scoop by mouth daily.    Historical Provider, MD  dibucaine (NUPERCAINAL) 1 % ointment Apply 1 application topically as needed for pain.    Historical Provider, MD  etodolac (LODINE) 500 MG tablet Take 500 mg by mouth 2 (two) times daily as needed. After meals, for foot pain    Historical Provider, MD  ferrous fumarate (HEMOCYTE - 106 MG FE) 325 (106 FE) MG TABS tablet Take 1 tablet by mouth 2 (two) times daily.    Historical Provider, MD  gentamicin cream (GARAMYCIN) 0.1 % Apply 1 application topically as needed.    Historical Provider, MD  hydrocortisone cream 1 % Apply 1 application topically 2 (two) times daily as needed for itching.    Historical Provider, MD  ibuprofen (ADVIL,MOTRIN) 200 MG tablet Take 200 mg by mouth daily as needed.  Historical Provider, MD  LACTOBACILLUS PO Take 2 tablets by mouth 2 (two) times daily.    Historical Provider, MD  losartan (COZAAR) 100 MG tablet Take 100 mg by mouth daily.    Historical Provider, MD  oxycodone (OXY-IR) 5 MG capsule Take 5 mg by mouth every 8 (eight) hours as needed.    Historical Provider, MD  psyllium (METAMUCIL) 58.6 % powder Take 2 packets by mouth 3 (three) times daily.    Historical Provider, MD  ribavirin (COPEGUS) 200 MG tablet Take 3 tablets (600 mg total) by mouth 2 (two) times daily. 07/13/14   Thayer Headings, MD  sildenafil (VIAGRA) 25 MG tablet Take 25 mg by mouth daily as needed for erectile dysfunction.    Historical  Provider, MD  simethicone (MYLICON) 80 MG chewable tablet Chew 80 mg by mouth 2 (two) times daily as needed for flatulence.    Historical Provider, MD  Sofosbuvir 400 MG TABS Take 1 tablet by mouth daily. 07/13/14   Thayer Headings, MD    Family History No family history on file.  Social History Social History  Substance Use Topics  . Smoking status: Never Smoker  . Smokeless tobacco: Not on file  . Alcohol use 2.4 oz/week    4 Cans of beer per week     Allergies   Patient has no known allergies.   Review of Systems Review of Systems  All other systems reviewed and are negative.    Physical Exam Updated Vital Signs BP (!) 179/92 (BP Location: Right Arm)   Pulse (!) 103   Temp 98.5 F (36.9 C) (Oral)   Resp 16   SpO2 96%   Physical Exam  Constitutional: He is oriented to person, place, and time. He appears well-developed and well-nourished.  HENT:  Head: Normocephalic and atraumatic.  Right Ear: External ear normal.  Left Ear: External ear normal.  Eyes: Conjunctivae and EOM are normal. Pupils are equal, round, and reactive to light.  Neck: Normal range of motion and phonation normal. Neck supple.  Cardiovascular: Normal rate, regular rhythm and normal heart sounds.   Pulmonary/Chest: Effort normal and breath sounds normal. He exhibits no bony tenderness.  Abdominal: Soft. There is no tenderness.  Musculoskeletal: Normal range of motion.  1+ pitting edema bilaterally.  Neurological: He is alert and oriented to person, place, and time. No cranial nerve deficit or sensory deficit. He exhibits normal muscle tone. Coordination normal.  Skin: Skin is warm, dry and intact.  Nursing note and vitals reviewed.    ED Treatments / Results   DIAGNOSTIC STUDIES: Oxygen Saturation is 96% on room air, adequate by my interpretation.    COORDINATION OF CARE: 3:47 AM Discussed treatment plan with pt at bedside and pt agreed to plan.    Labs (all labs ordered are listed,  but only abnormal results are displayed) Labs Reviewed  COMPREHENSIVE METABOLIC PANEL - Abnormal; Notable for the following:       Result Value   Sodium 133 (*)    Potassium 3.4 (*)    Chloride 99 (*)    CO2 21 (*)    Glucose, Bld 147 (*)    All other components within normal limits  ACETAMINOPHEN LEVEL - Abnormal; Notable for the following:    Acetaminophen (Tylenol), Serum <10 (*)    All other components within normal limits  RAPID URINE DRUG SCREEN, HOSP PERFORMED - Abnormal; Notable for the following:    Cocaine POSITIVE (*)    All other  components within normal limits  ETHANOL  SALICYLATE LEVEL  CBC    EKG  EKG Interpretation None       Radiology No results found.  Procedures Procedures (including critical care time)  Medications Ordered in ED Medications  amLODipine (NORVASC) tablet 10 mg (not administered)  aspirin EC tablet 81 mg (not administered)  Cholestyramine POWD 1 scoop (not administered)  etodolac (LODINE) tablet 500 mg (not administered)  ferrous fumarate (HEMOCYTE - 106 mg FE) tablet 106 mg of iron (not administered)  losartan (COZAAR) tablet 100 mg (not administered)  ribavirin (COPEGUS) tablet 600 mg (not administered)  Sofosbuvir TABS 1 tablet (not administered)  loperamide (IMODIUM) capsule 4 mg (not administered)  acetaminophen (TYLENOL) tablet 650 mg (not administered)  ibuprofen (ADVIL,MOTRIN) tablet 600 mg (not administered)  zolpidem (AMBIEN) tablet 5 mg (not administered)  nicotine (NICODERM CQ - dosed in mg/24 hours) patch 21 mg (not administered)  ondansetron (ZOFRAN) tablet 4 mg (not administered)  alum & mag hydroxide-simeth (MAALOX/MYLANTA) 200-200-20 MG/5ML suspension 30 mL (not administered)     Initial Impression / Assessment and Plan / ED Course  I have reviewed the triage vital signs and the nursing notes.  Pertinent labs & imaging results that were available during my care of the patient were reviewed by me and considered  in my medical decision making (see chart for details).  Clinical Course as of Oct 18 414  Thu Oct 17, 2016  0414 At this time the patient is medically cleared for treatment by the psychiatry service.  [EW]    Clinical Course User Index [EW] Daleen Bo, MD    Medications  amLODipine (NORVASC) tablet 10 mg (not administered)  aspirin EC tablet 81 mg (not administered)  Cholestyramine POWD 1 scoop (not administered)  etodolac (LODINE) tablet 500 mg (not administered)  ferrous fumarate (HEMOCYTE - 106 mg FE) tablet 106 mg of iron (not administered)  losartan (COZAAR) tablet 100 mg (not administered)  ribavirin (COPEGUS) tablet 600 mg (not administered)  Sofosbuvir TABS 1 tablet (not administered)  loperamide (IMODIUM) capsule 4 mg (not administered)  acetaminophen (TYLENOL) tablet 650 mg (not administered)  ibuprofen (ADVIL,MOTRIN) tablet 600 mg (not administered)  zolpidem (AMBIEN) tablet 5 mg (not administered)  nicotine (NICODERM CQ - dosed in mg/24 hours) patch 21 mg (not administered)  ondansetron (ZOFRAN) tablet 4 mg (not administered)  alum & mag hydroxide-simeth (MAALOX/MYLANTA) 200-200-20 MG/5ML suspension 30 mL (not administered)    Patient Vitals for the past 24 hrs:  BP Temp Temp src Pulse Resp SpO2  10/16/16 2244 (!) 179/92 98.5 F (36.9 C) Oral (!) 103 16 96 %    TTS consult   Final Clinical Impressions(s) / ED Diagnoses   Final diagnoses:  Suicidal ideation  Cocaine abuse    Suicidal ideation, with cocaine abuse.  Noncompliance with usual treatment for psychiatric illness.  Patient requires treatment by psychiatry and likely will need to be admitted.   Nursing Notes Reviewed/ Care Coordinated Applicable Imaging Reviewed Interpretation of Laboratory Data incorporated into ED treatment  Plan: Admit  New Prescriptions New Prescriptions   No medications on file   I personally performed the services described in this documentation, which was scribed  in my presence. The recorded information has been reviewed and is accurate.     Daleen Bo, MD 10/17/16 [id number]

## 2016-10-17 NOTE — ED Notes (Addendum)
Pt valuables retrieved from security. Pt wants belongings sent home with wife. Pt signed form. Pt military ID held per pt request and secured with belongings in cabinet.

## 2016-10-18 DIAGNOSIS — F1410 Cocaine abuse, uncomplicated: Secondary | ICD-10-CM | POA: Diagnosis not present

## 2016-10-18 NOTE — ED Notes (Signed)
Regular Diet ordered for Dinner. 

## 2016-10-18 NOTE — ED Notes (Signed)
Regular Diet was ordered for Lunch. 

## 2016-10-18 NOTE — ED Notes (Signed)
Patient eating dinner at this time.

## 2016-10-19 DIAGNOSIS — F1410 Cocaine abuse, uncomplicated: Secondary | ICD-10-CM | POA: Diagnosis not present

## 2016-10-19 LAB — CBG MONITORING, ED: Glucose-Capillary: 201 mg/dL — ABNORMAL HIGH (ref 65–99)

## 2016-10-19 NOTE — ED Notes (Signed)
Pt given snacks and drink.  No problems voiced by pt.  Sitter remains at bedside.

## 2016-10-19 NOTE — ED Notes (Signed)
Pt eating dinner at this time

## 2016-10-20 DIAGNOSIS — F1410 Cocaine abuse, uncomplicated: Secondary | ICD-10-CM | POA: Diagnosis not present

## 2016-10-20 LAB — CBG MONITORING, ED: GLUCOSE-CAPILLARY: 162 mg/dL — AB (ref 65–99)

## 2016-10-20 MED ORDER — METFORMIN HCL ER 500 MG PO TB24
1500.0000 mg | ORAL_TABLET | Freq: Every day | ORAL | Status: DC
  Administered 2016-10-20 – 2016-10-22 (×3): 1500 mg via ORAL
  Filled 2016-10-20 (×3): qty 3

## 2016-10-20 MED ORDER — MELOXICAM 15 MG PO TABS
15.0000 mg | ORAL_TABLET | Freq: Every day | ORAL | Status: DC | PRN
  Administered 2016-10-20 – 2016-10-22 (×3): 15 mg via ORAL
  Filled 2016-10-20 (×3): qty 1

## 2016-10-20 MED ORDER — MIRTAZAPINE 15 MG PO TABS
15.0000 mg | ORAL_TABLET | Freq: Every day | ORAL | Status: DC
  Administered 2016-10-20 – 2016-10-21 (×2): 15 mg via ORAL
  Filled 2016-10-20 (×2): qty 1

## 2016-10-20 MED ORDER — ESCITALOPRAM OXALATE 10 MG PO TABS
20.0000 mg | ORAL_TABLET | Freq: Every day | ORAL | Status: DC
  Administered 2016-10-20 – 2016-10-22 (×3): 20 mg via ORAL
  Filled 2016-10-20 (×3): qty 2

## 2016-10-20 MED ORDER — ATORVASTATIN CALCIUM 10 MG PO TABS
20.0000 mg | ORAL_TABLET | Freq: Every day | ORAL | Status: DC
  Administered 2016-10-20 – 2016-10-22 (×3): 20 mg via ORAL
  Filled 2016-10-20 (×3): qty 2

## 2016-10-20 NOTE — BH Assessment (Addendum)
Tele Assessment Note   [name] is an 61 y.o. marrieed male who presents unaccompanied to [facility] reporting suicidal ideation and symptoms of depression. Pt reports he is being treated for depression and PTSD through the [facility] in [place]. He reports prior to arriving at [facility] he attempted suicide by overdosing on 1/4 gram of cocaine. He says he own a gun and was going to shoot himself but he could not locate it. Pt says he has multiple medical problems, takes multiple medications and he is "just tired." Pt says "I didn't want to take all these medications" and hadn't taken medications in three days due to apathy. He continues to report suicidal ideation but says he will not harm himself while in the hospital. Pt denies any history of prior suicide attempts. Pt reports symptoms including crying social withdrawal, loss of interest in usual pleasures, fatigue, irritability, decreased concentration and feelings of guilt and hopelessness. He reports having frequent flashbacks of experiences while in the [employer]. He says he has episodes when he smells gun powder and white phosphorous. Pt also reports nightmares. He denies current homicidal ideation or history of violence.   Pt reports he drinks alcohol occasionally but rarely to excess. 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 He reports he relapsed after his father died in 2016/06/19 and has been using approximately every other day. Pt denies other substance use. Pt's urine drug screen is positive for cocaine.  Pt identifies his medical problems as his primary stressor. He says he is on disability and cannot work at all or he will lose his benefits. He lives with his wife and identifies her as his primary support. Pt denies current legal problems.  Pt reports he is receiving outpatient psychiatric medication management through Dr. Rick Duff at the [facility] in [place]. His therapist is Martinique Nivens. Pt denies any  history of inpatient psychiatric treatment. Pt states he had substance abuse treatment through [facility] in 2003.  Pt is dressed in hospital scrubs, drowsy, oriented x4 with normal speech and normal motor behavior. Eye contact is poor. Pt's mood is depressed and affect is congruent with mood. Thought process is coherent and relevant. There is no indication Pt is currently responding to internal stimuli or experiencing delusional thought content. Pt was cooperative throughout assessment. He says he wants to be transferred to the [facility] in [place].    Diagnosis: Major Depressive Disorder, Recurrent, Severe With Psychotic Features; Posttraumatic Stress Disorder  Past Medical History:  Past Medical History:  Diagnosis Date  . Hallucinations   . PTSD (post-traumatic stress disorder)   . Suicidal ideation     No past surgical history on file.  Family History: No family history on file.  Social History:  reports that he has never smoked. He does not have any smokeless tobacco history on file. He reports that he drinks about 2.4 oz of alcohol per week . He reports that he does not use drugs.  Additional Social History:  Alcohol / Drug Use Pain Medications: See MAR Prescriptions: See MAR Over the Counter: See MAR History of alcohol / drug use?: Yes Longest period of sobriety (when/how long): Three years Negative Consequences of Use: Personal relationships, Financial Substance #1 Name of Substance 1: Cocaine (crack) 1 - Age of First Use: 20 1 - Amount (size/oz): Varies 1 - Frequency: Every other day 1 - Duration: Ongoing, four months this episode 1 - Last Use / Amount: 10/16/16  CIWA: CIWA-Ar  BP: (!) 162/92 Pulse Rate: 65 COWS:    PATIENT STRENGTHS: (choose at least two) Ability for insight Average or above average intelligence Capable of independent living Occupational psychologist fund of knowledge Motivation for treatment/growth Supportive  family/friends  Allergies: No Known Allergies  Home Medications:  (Not in a hospital admission)  OB/GYN Status:  No LMP for male patient.  General Assessment Data Location of Assessment: [facility] ED TTS Assessment: In system Is this a Tele or Face-to-Face Assessment?: Face-to-Face Is this an Initial Assessment or a Re-assessment for this encounter?: Initial Assessment Marital status: Married [place] name: NA Is patient pregnant?: No Pregnancy Status: No Living Arrangements: Spouse/significant other Can pt return to current living arrangement?: Yes Admission Status: Voluntary Is patient capable of signing voluntary admission?: Yes Referral Source: Self/Family/Friend Insurance type: VA benefits     Crisis Care Plan Living Arrangements: Spouse/significant other Legal Guardian: Other: (Self) Name of Psychiatrist: Dr. Rick Duff at [facility] Name of Therapist: Martinique Nivens  Education Status Is patient currently in school?: No Current Grade: NA Highest grade of school patient has completed: 12 Name of school: NA Contact person: NA  Risk to self with the past 6 months Suicidal Ideation: Yes-Currently Present Has patient been a risk to self within the past 6 months prior to admission? : Yes Suicidal Intent: Yes-Currently Present Has patient had any suicidal intent within the past 6 months prior to admission? : Yes Is patient at risk for suicide?: Yes Suicidal Plan?: Yes-Currently Present Has patient had any suicidal plan within the past 6 months prior to admission? : Yes Specify Current Suicidal Plan: Pt reports he attempted to OD on cocaine Access to Means: Yes Specify Access to Suicidal Means: Pt reports he used a quarter ounce of cocaine What has been your use of drugs/alcohol within the last 12 months?: Pt has a history of cocaine use Previous Attempts/Gestures: No How many times?: 0 Other Self Harm Risks: None Triggers for Past Attempts: None known Intentional Self  Injurious Behavior: None Family Suicide History: No Recent stressful life event(s): Other (Comment) (Medical problems) Persecutory voices/beliefs?: No Depression: Yes Depression Symptoms: Despondent, Isolating, Fatigue, Guilt, Loss of interest in usual pleasures, Feeling worthless/self pity, Feeling angry/irritable Substance abuse history and/or treatment for substance abuse?: Yes Suicide prevention information given to non-admitted patients: Not applicable  Risk to Others within the past 6 months Homicidal Ideation: No Does patient have any lifetime risk of violence toward others beyond the six months prior to admission? : No Thoughts of Harm to Others: No Current Homicidal Intent: No Current Homicidal Plan: No Access to Homicidal Means: No Identified Victim: None History of harm to others?: No Assessment of Violence: None Noted Violent Behavior Description: Pt denies history of violence Does patient have access to weapons?: Yes (Comment) (Pt reports he owns a gun) Criminal Charges Pending?: No Does patient have a court date: No Is patient on probation?: No  Psychosis Hallucinations: Olfactory (Pt reports flashbacks when he smells gunpowder) Delusions: None noted  Mental Status Report Appearance/Hygiene: In scrubs Eye Contact: Poor Motor Activity: Unremarkable Speech: Logical/coherent Level of Consciousness: Drowsy Mood: Depressed Affect: Depressed Anxiety Level: None Thought Processes: Coherent, Relevant Judgement: Unimpaired Orientation: Person, Place, Time, Situation, Appropriate for developmental age Obsessive Compulsive Thoughts/Behaviors: None  Cognitive Functioning Concentration: Normal Memory: Recent Intact, Remote Intact IQ: Average Insight: Fair Impulse Control: Fair Appetite: Fair Weight Loss: 0 Weight Gain: 0 Sleep: No Change Total Hours of Sleep: 8 (takes medication for sleep) Vegetative Symptoms: None  ADLScreening [facility] Assessment  Services) Patient's cognitive ability adequate to safely complete daily activities?: Yes Patient able to express need for assistance with ADLs?: Yes Independently performs ADLs?: Yes (appropriate for developmental age)  Prior Inpatient Therapy Prior Inpatient Therapy: No Prior Therapy Dates: NA Prior Therapy Facilty/Provider(s): NA Reason for Treatment: NA  Prior Outpatient Therapy Prior Outpatient Therapy: Yes Prior Therapy Dates: Current Prior Therapy Facilty/Provider(s): [facility] Reason for Treatment: Depression, anxiety, cocaine use Does patient have an ACCT team?: No Does patient have Intensive In-House Services?  : No Does patient have [facility] services? : No Does patient have P4CC services?: No  ADL Screening (condition at time of admission) Patient's cognitive ability adequate to safely complete daily activities?: Yes Is the patient deaf or have difficulty hearing?: No Does the patient have difficulty seeing, even when wearing glasses/contacts?: No Does the patient have difficulty concentrating, remembering, or making decisions?: No Patient able to express need for assistance with ADLs?: Yes Does the patient have difficulty dressing or bathing?: No Independently performs ADLs?: Yes (appropriate for developmental age) Does the patient have difficulty walking or climbing stairs?: No Weakness of Legs: Both Weakness of Arms/Hands: None  Home Assistive Devices/Equipment Home Assistive Devices/Equipment: Cane (specify quad or straight)    Abuse/Neglect Assessment (Assessment to be complete while patient is alone) Physical Abuse: Denies Verbal Abuse: Denies Sexual Abuse: Denies Exploitation of patient/patient's resources: Denies Self-Neglect: Denies     Regulatory affairs officer (For Healthcare) Does Patient Have a Medical Advance Directive?: No Would patient like information on creating a medical advance directive?: Yes (Inpatient - patient requests chaplain consult to  create a medical advance directive)    Additional Information 1:1 In Past 12 Months?: No CIRT Risk: No Elopement Risk: No Does patient have medical clearance?: Yes     Disposition: Gave clinical report to Lindon Romp, NP who said Pt meets criteria for inpatient psychiatric treatment. TTS will contact [facility] in [place]. Notified Dr. Cyril Mourning Ward and Serena Colonel, RN of recommendation.  Disposition Initial Assessment Completed for this Encounter: Yes Disposition of Patient: Inpatient treatment program Type of inpatient treatment program: Adult   Evelena Peat, [facility], [facility], [facility] Triage Specialist [phone]   Evelena Peat 10/20/2016 2:56 AM

## 2016-10-20 NOTE — ED Provider Notes (Signed)
3:15 AM  D/w Marijean Bravo with behavioral health hospital. They recommend inpatient treatment. Patient requesting to go to the [facility]. Behavioral Health will work on this transfer.   , Delice Bison, DO 10/20/16 [phone]

## 2016-10-20 NOTE — ED Notes (Signed)
Carb modified breakfast tray ordered 

## 2016-10-20 NOTE — ED Notes (Signed)
[facility] counselor at bedside for assessment.

## 2016-10-21 ENCOUNTER — Encounter (HOSPITAL_COMMUNITY): Payer: Self-pay | Admitting: *Deleted

## 2016-10-21 DIAGNOSIS — F1410 Cocaine abuse, uncomplicated: Secondary | ICD-10-CM | POA: Diagnosis not present

## 2016-10-21 NOTE — ED Notes (Signed)
Signature paperwork signed and faxed.

## 2016-10-21 NOTE — ED Notes (Signed)
Dinner Warden/ranger ordered -- Snack given.

## 2016-10-21 NOTE — Progress Notes (Signed)
Referral packet faxed to [facility].  CSW placed follow-up call to confirm receipt and was told Pt will be added to the [place] waiting list.  Disposition CSW should follow-up on 5/8, after 9AM, for updated status.   [name]. Judi Cong, MSW, [place] Work Disposition [phone]

## 2016-10-21 NOTE — BHH Counselor (Signed)
[place] Assessment Progress Note  Pt reassessed today. He had to be reassessed over the phone b/c the telepsych machine would not work. Pt still endorses hopelessness and suicidality, as well as a desire to "see my people in [place]"-referring to the [place]. IP still recommended.   Kenna Gilbert. Lovena Le, [place], [place], [facility] Counselor

## 2016-10-21 NOTE — ED Notes (Signed)
Meal tray given 

## 2016-10-21 NOTE — ED Notes (Signed)
Pt requesting medication for foot pain, notified pharmacy for meloxicam. Pt made aware of delay.

## 2016-10-22 DIAGNOSIS — F1410 Cocaine abuse, uncomplicated: Secondary | ICD-10-CM | POA: Diagnosis not present

## 2016-10-22 LAB — CBG MONITORING, ED: Glucose-Capillary: 163 mg/dL — ABNORMAL HIGH (ref 65–99)

## 2016-10-22 NOTE — ED Notes (Signed)
Attempted to call report - staff member will admission nurse to return call.

## 2016-10-22 NOTE — Progress Notes (Signed)
CSW spoke with Mickel Baas at [facility] to discuss possible placement. Mickel Baas requested updated notes on the pt to confirm continued inpt need and information was faxed and currently being reviewed.  Lind Covert, MSW, [place] Disposition [phone]

## 2016-10-22 NOTE — ED Notes (Signed)
[facility], [place] - called and advised they are at capacity so unable to accept pt.

## 2016-10-22 NOTE — Progress Notes (Signed)
Pt has been referred to [facility] in [place], [place], [place], and [place].  Lind Covert, MSW, [place] Disposition [phone]

## 2016-10-22 NOTE — ED Notes (Signed)
Patient was given a snack patient didn't want a drink, because he had some water, and a Carb Mortified Diet was ordered for Lunch.

## 2016-10-22 NOTE — Progress Notes (Signed)
CSW called Mickel Baas at [facility] to ensure fax was received and is being reviewed. No answer therefore CSW left a v/m for Mickel Baas to return the call.  Lind Covert, MSW, [place] Disposition [phone]

## 2016-10-22 NOTE — ED Notes (Signed)
Pt requesting Meloxicam d/t feet pain. Advised pt sent note to pharmacy.

## 2016-10-22 NOTE — Progress Notes (Signed)
CSW attempted to speak with the [facility] to determine the pt's bed status at [phone] (no ans); [phone] (no ans); [phone] no ans- HIPPA compliant voicemail was left for Mickel Baas at ext [phone]. CSW will follow up and refax the referral to the [place].  Lind Covert, MSW, [place] Disposition [phone]

## 2016-10-22 NOTE — ED Notes (Signed)
Pt ambulatory to shower w/Sitter. 

## 2016-10-22 NOTE — Progress Notes (Signed)
Pt has been accepted to [facility] in [place]. Accepting is Dr. Dorris Singh, MD and pt able to be transferred immediately.  If the pt arrives by 20:00, he will be taken to bldg 8 on first floor and call to report is [phone] ext [phone]  If pt arrives after 20:00 he will need to go to bldg 2 and call to report is  [phone] ext [phone].  Spoke with Jacqlyn Larsen, RN and she is aware of the acceptance and arranging transfer.  Lind Covert, MSW, [place] Disposition [phone]

## 2016-10-22 NOTE — ED Notes (Signed)
Pt aware and is in agreement w/tx plan - accepted to [facility].

## 2016-10-22 NOTE — ED Notes (Signed)
Pt noted w/eyeglasses on bedside table. Cane hanging on counter.

## 2016-10-22 NOTE — ED Notes (Signed)
Patient CBG was 163.

## 2016-10-22 NOTE — BHH Counselor (Addendum)
Pt cooperative and oriented x 4 during reassessment. He reports he slept last night the best he has since presenting to ED. Pt sts they have modified his diet, so he misses eating biscuit and bacon. He reports SI earlier today, but pt denies SI at this moment. He denies HI. Pt sts when he is "threatened" by other people, he tends to think he is back in a combat situation. He reports having had two flashbacks this am. Pt says he was in the [employer]. Writer updated pt on his disposition. Pt indicates he wants to go to the [facility] where he has been referred by TTS. Pt continues to need inpatient treatment.  Arnold Long, [place] [facility] Triage Specialist

## 2023-01-13 ENCOUNTER — Encounter: Payer: Self-pay | Admitting: Gastroenterology

## 2023-01-28 ENCOUNTER — Telehealth: Payer: Self-pay | Admitting: *Deleted

## 2023-01-28 ENCOUNTER — Ambulatory Visit (AMBULATORY_SURGERY_CENTER): Payer: Self-pay | Admitting: *Deleted

## 2023-01-28 VITALS — Ht 73.0 in | Wt 197.0 lb

## 2023-01-28 DIAGNOSIS — C20 Malignant neoplasm of rectum: Secondary | ICD-10-CM

## 2023-01-28 DIAGNOSIS — Z85038 Personal history of other malignant neoplasm of large intestine: Secondary | ICD-10-CM

## 2023-01-28 MED ORDER — POLYETHYLENE GLYCOL 3350 17 GM/SCOOP PO POWD: 1.0000 | Freq: Every day | ORAL | 3 refills | Status: DC

## 2023-01-28 MED ORDER — PEG 3350-KCL-NA BICARB-NACL 420 G PO SOLR: 4000.0000 mL | Freq: Once | ORAL | 0 refills | Status: AC

## 2023-01-28 MED ORDER — PEG 3350-KCL-NA BICARB-NACL 420 G PO SOLR: 4000.0000 mL | Freq: Once | ORAL | 0 refills | Status: DC

## 2023-01-28 NOTE — Telephone Encounter (Signed)
Attempt to reach pt for pre-visit. LM with call back #. Will attempt other number in profile 

## 2023-01-28 NOTE — Progress Notes (Signed)
 Pt's name and DOB verified at the beginning of the pre-visit.  Pt denies any difficulty with ambulating,sitting, laying down or rolling side to side Gave both [facility] main # and MD on call # prior to instructions.  No egg or soy allergy known to patient  No issues known to pt with past sedation with any surgeries or procedures Pt denies having issues being intubated Pt has no issues moving head neck or swallowing No FH of Malignant Hyperthermia Pt is not on diet pills Pt is not on home 02  Pt is not on blood thinners  Pt denies issues with constipation  Pt is not on dialysis Pt denise any abnormal heart rhythms  Pt denies any upcoming cardiac testing Pt encouraged to use to use Singlecare or Goodrx to reduce cost  Patient's chart reviewed by Cathlyn Parsons CNRA prior to pre-visit and patient appropriate for the [facility].  Pre-visit completed and red dot placed by patient's name on their procedure day (on provider's schedule).  . Visit by phone Pt states weight is 197 lb Instructed pt why it is important to and  to call if they have any changes in health or new medications. Directed them to the # given and on instructions.   Pt states they will.  Instructions reviewed with pt and pt states understanding. Instructed to review again prior to procedure. Pt states they will.  Instructions sent by mail with coupon and by my chart

## 2023-02-07 ENCOUNTER — Encounter: Payer: Self-pay | Admitting: Gastroenterology

## 2023-02-07 ENCOUNTER — Ambulatory Visit (AMBULATORY_SURGERY_CENTER): Payer: No Typology Code available for payment source | Admitting: Gastroenterology

## 2023-02-07 VITALS — BP 181/95 | HR 67 | Temp 96.5°F | Resp 14 | Ht 73.0 in | Wt 197.0 lb

## 2023-02-07 DIAGNOSIS — Z08 Encounter for follow-up examination after completed treatment for malignant neoplasm: Secondary | ICD-10-CM

## 2023-02-07 DIAGNOSIS — C20 Malignant neoplasm of rectum: Secondary | ICD-10-CM

## 2023-02-07 DIAGNOSIS — D123 Benign neoplasm of transverse colon: Secondary | ICD-10-CM | POA: Diagnosis not present

## 2023-02-07 DIAGNOSIS — Z85038 Personal history of other malignant neoplasm of large intestine: Secondary | ICD-10-CM

## 2023-02-07 MED ORDER — SODIUM CHLORIDE 0.9 % IV SOLN: 500.0000 mL | Freq: Once | INTRAVENOUS | Status: DC

## 2023-02-07 NOTE — Progress Notes (Signed)
VS completed by CW.   Pt's states no medical or surgical changes since previsit or office visit.  

## 2023-02-07 NOTE — Progress Notes (Signed)
Uneventful anesthetic. Report to pacu rn. Vss. Care resumed by rn. 

## 2023-02-07 NOTE — Progress Notes (Signed)
Called to room to assist during endoscopic procedure.  Patient ID and intended procedure confirmed with present staff. Received instructions for my participation in the procedure from the performing physician.  

## 2023-02-07 NOTE — Progress Notes (Signed)
[facility] History and Physical   Primary Care Physician:  Patient, No Pcp Per   Reason for Procedure:   History of rectal cancer  Plan:    Surveillance colonoscopy     HPI: [name] is a 67 y.o. male with a history of rectal cancer diagnosed in 2014 undergoing surveillance colonoscopy.  His last colonoscopy was in 2018 and no polyps were found.     PREVIOUS ENDOSCOPY: 03/05/2017 colonoscopy for history of rectal adenocarcinoma. Endoscopic Diagnosis Rectal anastomosis Moderate sigmoid diverticulosis Retroflexed exam attempted but not performed secondary to patient discomfort  Recommendations Colonoscopy 5 years  01/27/2014 colon: "GROSS FINDINGS: Pan-diverticula is noted. The  anastomosis  is visualized and was normal."  2014: adjuvant chemoRT  09/22/2012 Low anterior resection path: "Diagnosis  Sigmoid colon, margin, and rings, excision:  Moderately differentiated adenocarcinoma.  Tumor grossly measures 5.1 x 4.3 cm.  Tumor extends through muscularis propria.  Proximal, distal, and radial margins are free of tumor.  25 lymph nodes, negative for metastatic tumor."  08/21/2012 colon: "Impressions: Condition: Stable Applecore mass at 8-10 cm  above verge just above 3rd rectal valve rectosigmoid jn extending to 15 cm 8cm  lengths, narrow lumen but not obstructing. Biopsy taken. Diverticulosis found  in  the ascending colon and sigmoid colon (562.10). Tattoo placed 5cm above and  just  below lesion."    Past Medical History:  Diagnosis Date   Cancer (HCC)    colon   Depression    Diabetes mellitus without complication (HCC)    Hallucinations    Hypercholesteremia    Hypertension    PTSD (post-traumatic stress disorder)    Suicidal ideation     Past Surgical History:  Procedure Laterality Date   colon rectal surgery     2014   COLONOSCOPY     CORONARY ANGIOPLASTY WITH STENT PLACEMENT      Prior to Admission medications    Medication Sig Start Date End Date Taking? Authorizing Provider  amLODipine (NORVASC) 10 MG tablet Take 10 mg by mouth daily.   Yes [provider]  aspirin EC 81 MG tablet Take 81 mg by mouth daily.   Yes [provider]  atorvastatin (LIPITOR) 20 MG tablet Take 20 mg by mouth daily.   Yes [provider]  escitalopram (LEXAPRO) 20 MG tablet Take 20 mg by mouth daily.   Yes [provider]  insulin glargine-yfgn (SEMGLEE) 100 UNIT/ML injection Inject into the skin. 04/22/22  Yes [provider]  LACTOBACILLUS PO Take 2 tablets by mouth 2 (two) times daily.   Yes [provider]  losartan (COZAAR) 100 MG tablet Take 100 mg by mouth daily.   Yes [provider]  metFORMIN (GLUCOPHAGE-XR) 750 MG 24 hr tablet Take 1,500 mg by mouth daily.   Yes [provider]  metoprolol tartrate (LOPRESSOR) 50 MG tablet Take by mouth. 01/14/23  Yes [provider]  mirtazapine (REMERON) 15 MG tablet Take 15 mg by mouth at bedtime.   Yes [provider]  prazosin (MINIPRESS) 1 MG capsule Take by mouth. 07/16/22  Yes [provider]  simethicone (MYLICON) 80 MG chewable tablet Chew 80 mg by mouth 2 (two) times daily as needed for flatulence.   Yes [provider]  traZODone (DESYREL) 100 MG tablet Take 50-200 mg by mouth at bedtime as needed (for insomnia).   Yes [provider]  Cholestyramine POWD Take 1 scoop by mouth daily. MIXED IN 6 OUNCES OF WATER OR JUICE  [provider]  dibucaine (NUPERCAINAL) 1 % ointment Apply 1 application topically daily as needed (for anal pain).     [provider]  gentamicin cream (GARAMYCIN) 0.1 % Apply 1 application topically daily as needed (to affected sites).  Patient not taking: Reported on 01/28/2023    [provider]  hydrocortisone cream 1 % Apply 1 application topically 2 (two) times daily as needed for itching.    [provider]  hydrOXYzine (VISTARIL) 25 MG capsule Take by mouth. 07/16/22   [provider]  loperamide (IMODIUM) 2 MG capsule Take by mouth. 07/02/22   [provider]  meloxicam (MOBIC) 15 MG tablet Take 15 mg by mouth daily as needed (for foot pain). Patient not taking: Reported on 01/28/2023    [provider]  polyethylene glycol powder (GLYCOLAX/MIRALAX) 17 GM/SCOOP powder Take 255 g by mouth daily. 01/28/23   [name], MD  psyllium (METAMUCIL) 58.6 % powder Take 2 packets by mouth every morning. MIXED IN 8 OUNCES OF WATER OR JUICE Patient not taking: Reported on 01/28/2023    [provider]  ribavirin (COPEGUS) 200 MG tablet Take 3 tablets (600 mg total) by mouth 2 (two) times daily. Patient not taking: Reported on 10/17/2016 07/13/14   [name], MD  sildenafil (VIAGRA) 25 MG tablet Take 25 mg by mouth daily as needed for erectile dysfunction.    [provider]  Sofosbuvir 400 MG TABS Take 1 tablet by mouth daily. Patient not taking: Reported on 10/17/2016 07/13/14   [name], MD    Current Outpatient Medications  Medication Sig Dispense Refill   amLODipine (NORVASC) 10 MG tablet Take 10 mg by mouth daily.     aspirin EC 81 MG tablet Take 81 mg by mouth daily.     atorvastatin (LIPITOR) 20 MG tablet Take 20 mg by mouth daily.     escitalopram (LEXAPRO) 20 MG tablet Take 20 mg by mouth daily.     insulin glargine-yfgn (SEMGLEE) 100 UNIT/ML injection Inject into the skin.     LACTOBACILLUS PO Take 2 tablets by mouth 2 (two) times daily.     losartan (COZAAR) 100 MG tablet Take 100 mg by mouth daily.     metFORMIN (GLUCOPHAGE-XR) 750 MG 24 hr tablet Take 1,500 mg by mouth daily.     metoprolol tartrate (LOPRESSOR) 50 MG tablet Take by mouth.     mirtazapine (REMERON) 15 MG tablet Take 15 mg by mouth at bedtime.     prazosin (MINIPRESS) 1 MG capsule Take by mouth.     simethicone (MYLICON) 80 MG chewable tablet Chew 80 mg  by mouth 2 (two) times daily as needed for flatulence.     traZODone (DESYREL) 100 MG tablet Take 50-200 mg by mouth at bedtime as needed (for insomnia).     Cholestyramine POWD Take 1 scoop by mouth daily. MIXED IN 6 OUNCES OF WATER OR JUICE     dibucaine (NUPERCAINAL) 1 % ointment Apply 1 application topically daily as needed (for anal pain).      gentamicin cream (GARAMYCIN) 0.1 % Apply 1 application topically daily as needed (to affected sites).  (Patient not taking: Reported on 01/28/2023)     hydrocortisone cream 1 % Apply 1 application topically 2 (two) times daily as needed for itching.     hydrOXYzine (VISTARIL) 25 MG capsule Take by mouth.     loperamide (IMODIUM) 2 MG capsule Take by mouth.     meloxicam (  MOBIC) 15 MG tablet Take 15 mg by mouth daily as needed (for foot pain). (Patient not taking: Reported on 01/28/2023)     polyethylene glycol powder (GLYCOLAX/MIRALAX) 17 GM/SCOOP powder Take 255 g by mouth daily. 255 g 3   psyllium (METAMUCIL) 58.6 % powder Take 2 packets by mouth every morning. MIXED IN 8 OUNCES OF WATER OR JUICE (Patient not taking: Reported on 01/28/2023)     ribavirin (COPEGUS) 200 MG tablet Take 3 tablets (600 mg total) by mouth 2 (two) times daily. (Patient not taking: Reported on 10/17/2016) 168 tablet 2   sildenafil (VIAGRA) 25 MG tablet Take 25 mg by mouth daily as needed for erectile dysfunction.     Sofosbuvir 400 MG TABS Take 1 tablet by mouth daily. (Patient not taking: Reported on 10/17/2016) 28 tablet 2   Current Facility-Administered Medications  Medication Dose Route Frequency Provider Last Rate Last Admin   0.9 %  sodium chloride infusion  500 mL Intravenous Once [name], MD        Allergies as of 02/07/2023   (No Known Allergies)    Family History  Problem Relation Age of Onset   Colon cancer Neg Hx    Colon polyps Neg Hx    Esophageal cancer Neg Hx    Rectal cancer Neg Hx    Stomach cancer Neg Hx     Social History    Socioeconomic History   Marital status: Married    Spouse name: Not on file   Number of children: Not on file   Years of education: Not on file   Highest education level: Not on file  Occupational History   Not on file  Tobacco Use   Smoking status: Never   Smokeless tobacco: Not on file  Vaping Use   Vaping status: Never Used  Substance and Sexual Activity   Alcohol use: Yes    Alcohol/week: 4.0 standard drinks of alcohol    Types: 4 Cans of beer per week   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  Physical Exam: Vital signs BP (!) 189/102   Pulse 80   Temp (!) 96.5 F (35.8 C) (Temporal)   Ht 6\' 1"  (1.854 m)   Wt 197 lb (89.4 kg)   SpO2 99%   BMI 25.99 kg/m   General:   Alert,  Well-developed, well-nourished, pleasant and cooperative in NAD Airway:  Mallampati 1 Lungs:  Clear throughout to auscultation.   Heart:  Regular rate and rhythm; no murmurs, clicks, rubs,  or gallops. Abdomen:  Soft, nontender and nondistended. Normal bowel sounds.   Neuro/Psych:  Normal mood and affect. A and O x 3   Lawarence Meek E. Tomasa Rand, MD [facility]

## 2023-02-07 NOTE — Patient Instructions (Signed)
Discharge instructions given. Handouts on polyps and Diverticulosis. Resume previous medications. YOU HAD AN ENDOSCOPIC PROCEDURE TODAY AT THE [facility]:   Refer to the procedure report that was given to you for any specific questions about what was found during the examination.  If the procedure report does not answer your questions, please call your gastroenterologist to clarify.  If you requested that your care partner not be given the details of your procedure findings, then the procedure report has been included in a sealed envelope for you to review at your convenience later.  YOU SHOULD EXPECT: Some feelings of bloating in the abdomen. Passage of more gas than usual.  Walking can help get rid of the air that was put into your GI tract during the procedure and reduce the bloating. If you had a lower endoscopy (such as a colonoscopy or flexible sigmoidoscopy) you may notice spotting of blood in your stool or on the toilet paper. If you underwent a bowel prep for your procedure, you may not have a normal bowel movement for a few days.  Please Note:  You might notice some irritation and congestion in your nose or some drainage.  This is from the oxygen used during your procedure.  There is no need for concern and it should clear up in a day or so.  SYMPTOMS TO REPORT IMMEDIATELY:  Following lower endoscopy (colonoscopy or flexible sigmoidoscopy):  Excessive amounts of blood in the stool  Significant tenderness or worsening of abdominal pains  Swelling of the abdomen that is new, acute  Fever of 100F or higher  For urgent or emergent issues, a gastroenterologist can be reached at any hour by calling [phone]. Do not use MyChart messaging for urgent concerns.    DIET:  We do recommend a small meal at first, but then you may proceed to your regular diet.  Drink plenty of fluids but you should avoid alcoholic beverages for 24 hours.  ACTIVITY:  You should plan to take it  easy for the rest of today and you should NOT DRIVE or use heavy machinery until tomorrow (because of the sedation medicines used during the test).    FOLLOW UP: Our staff will call the number listed on your records the next business day following your procedure.  We will call around 7:15- 8:00 am to check on you and address any questions or concerns that you may have regarding the information given to you following your procedure. If we do not reach you, we will leave a message.     If any biopsies were taken you will be contacted by phone or by letter within the next 1-3 weeks.  Please call us at [phone] if you have not heard about the biopsies in 3 weeks.    SIGNATURES/CONFIDENTIALITY: You and/or your care partner have signed paperwork which will be entered into your electronic medical record.  These signatures attest to the fact that that the information above on your After Visit Summary has been reviewed and is understood.  Full responsibility of the confidentiality of this discharge information lies with you and/or your care-partner. 

## 2023-02-07 NOTE — Op Note (Signed)
[facility] Patient Name: [name] Procedure Date: 02/07/2023 2:07 PM MRN: [id number] Endoscopist: Lorin Picket E. Tomasa Rand , MD, [id number] Age: 67 Referring MD:  Date of Birth: 1955-08-14 Gender: Male Account #: [id number] Procedure:                Colonoscopy Indications:              High risk colon cancer surveillance: Personal                            history of rectal cancer (diagnosed/treated in 2014) Medicines:                Monitored Anesthesia Care Procedure:                Pre-Anesthesia Assessment:                           - Prior to the procedure, a History and Physical                            was performed, and patient medications and                            allergies were reviewed. The patient's tolerance of                            previous anesthesia was also reviewed. The risks                            and benefits of the procedure and the sedation                            options and risks were discussed with the patient.                            All questions were answered, and informed consent                            was obtained. Prior Anticoagulants: The patient has                            taken no anticoagulant or antiplatelet agents. ASA                            Grade Assessment: III - A patient with severe                            systemic disease. After reviewing the risks and                            benefits, the patient was deemed in satisfactory                            condition to undergo the procedure.  After obtaining informed consent, the colonoscope                            was passed under direct vision. Throughout the                            procedure, the patient's blood pressure, pulse, and                            oxygen saturations were monitored continuously. The                            Olympus CF-HQ190L ([id number]) Colonoscope was                             introduced through the anus and advanced to the the                            cecum, identified by appendiceal orifice and                            ileocecal valve. The colonoscopy was performed                            without difficulty. The patient tolerated the                            procedure well. The quality of the bowel                            preparation was adequate. The ileocecal valve,                            appendiceal orifice, and rectum were photographed.                            The bowel preparation used was GoLYTELY via split                            dose instruction. Scope In: 2:15:28 PM Scope Out: 2:33:14 PM Scope Withdrawal Time: 0 hours 14 minutes 29 seconds  Total Procedure Duration: 0 hours 17 minutes 46 seconds  Findings:                 The perianal and digital rectal examinations were                            normal. Pertinent negatives include normal                            sphincter tone and no palpable rectal lesions.                           A 4 mm polyp was found in the transverse colon. The  polyp was sessile. The polyp was removed with a                            cold snare. Resection and retrieval were complete.                            Estimated blood loss was minimal.                           Many large-mouthed and small-mouthed diverticula                            were found in the sigmoid colon, descending colon,                            transverse colon and ascending colon.                           There was evidence of a prior end-to-end                            colo-rectal anastomosis about 7 cm from the anal                            verge. This was patent and was characterized by                            healthy appearing mucosa. There was mild luminal                            narrowing.                           The exam was otherwise normal throughout the                             examined colon. Complications:            No immediate complications. Estimated Blood Loss:     Estimated blood loss was minimal. Impression:               - One 4 mm polyp in the transverse colon, removed                            with a cold snare. Resected and retrieved.                           - Moderate diverticulosis in the sigmoid colon, in                            the descending colon, in the transverse colon and                            in the ascending colon.                           -  Patent end-to-end colo-rectal anastomosis,                            characterized by healthy appearing mucosa. Recommendation:           - Patient has a contact number available for                            emergencies. The signs and symptoms of potential                            delayed complications were discussed with the                            patient. Return to normal activities tomorrow.                            Written discharge instructions were provided to the                            patient.                           - Resume previous diet.                           - Continue present medications.                           - Await pathology results.                           - Repeat colonoscopy in 5 years for surveillance. Wendelin Reader E. Tomasa Rand, MD 02/07/2023 2:40:59 PM This report has been signed electronically.

## 2023-02-10 ENCOUNTER — Telehealth: Payer: Self-pay

## 2023-02-10 NOTE — Telephone Encounter (Signed)
  Follow up Call-     02/07/2023    1:25 PM  Call back number  Post procedure Call Back phone  # [phone]  Permission to leave phone message Yes     Patient questions:  Do you have a fever, pain , or abdominal swelling? No. Pain Score  0 *  Have you tolerated food without any problems? Yes.    Have you been able to return to your normal activities? Yes.    Do you have any questions about your discharge instructions: Diet   No. Medications  No. Follow up visit  No.  Do you have questions or concerns about your Care? No.  Actions: * If pain score is 4 or above: No action needed, pain <4.

## 2023-02-13 NOTE — Progress Notes (Signed)
[name],  The polyp which I removed during your recent procedure was proven to be completely benign but is considered a "pre-cancerous" polyp that MAY have grown into cancer if it had not been removed.  Studies shows that at least 20% of women over age 67 and 30% of men over age 38 have pre-cancerous polyps.  Based on current nationally recognized surveillance guidelines for patients with a history of colorectal cancer, I recommend that you have a repeat colonoscopy in 5 years.   If you develop any new rectal bleeding, abdominal pain or significant bowel habit changes, please contact me before then.

## 2023-08-13 ENCOUNTER — Inpatient Hospital Stay (HOSPITAL_COMMUNITY)
Admission: EM | Admit: 2023-08-13 | Discharge: 2023-08-18 | DRG: 854 | Discharge status: Against Medical Advice | Payer: No Typology Code available for payment source | Attending: Internal Medicine | Admitting: Internal Medicine

## 2023-08-13 ENCOUNTER — Emergency Department (HOSPITAL_COMMUNITY): Payer: No Typology Code available for payment source

## 2023-08-13 ENCOUNTER — Other Ambulatory Visit: Payer: Self-pay

## 2023-08-13 ENCOUNTER — Encounter (HOSPITAL_COMMUNITY): Payer: Self-pay | Admitting: Emergency Medicine

## 2023-08-13 DIAGNOSIS — A4159 Other Gram-negative sepsis: Secondary | ICD-10-CM | POA: Diagnosis not present

## 2023-08-13 DIAGNOSIS — B961 Klebsiella pneumoniae [K. pneumoniae] as the cause of diseases classified elsewhere: Secondary | ICD-10-CM | POA: Diagnosis present

## 2023-08-13 DIAGNOSIS — Z955 Presence of coronary angioplasty implant and graft: Secondary | ICD-10-CM

## 2023-08-13 DIAGNOSIS — F32A Depression, unspecified: Secondary | ICD-10-CM | POA: Diagnosis present

## 2023-08-13 DIAGNOSIS — G4700 Insomnia, unspecified: Secondary | ICD-10-CM | POA: Insufficient documentation

## 2023-08-13 DIAGNOSIS — Z888 Allergy status to other drugs, medicaments and biological substances status: Secondary | ICD-10-CM

## 2023-08-13 DIAGNOSIS — K82A1 Gangrene of gallbladder in cholecystitis: Secondary | ICD-10-CM | POA: Diagnosis present

## 2023-08-13 DIAGNOSIS — I2510 Atherosclerotic heart disease of native coronary artery without angina pectoris: Secondary | ICD-10-CM | POA: Diagnosis present

## 2023-08-13 DIAGNOSIS — E860 Dehydration: Secondary | ICD-10-CM | POA: Diagnosis present

## 2023-08-13 DIAGNOSIS — Z9221 Personal history of antineoplastic chemotherapy: Secondary | ICD-10-CM

## 2023-08-13 DIAGNOSIS — Z79899 Other long term (current) drug therapy: Secondary | ICD-10-CM

## 2023-08-13 DIAGNOSIS — E1122 Type 2 diabetes mellitus with diabetic chronic kidney disease: Secondary | ICD-10-CM | POA: Diagnosis present

## 2023-08-13 DIAGNOSIS — B182 Chronic viral hepatitis C: Secondary | ICD-10-CM | POA: Diagnosis present

## 2023-08-13 DIAGNOSIS — K8042 Calculus of bile duct with acute cholecystitis without obstruction: Secondary | ICD-10-CM

## 2023-08-13 DIAGNOSIS — K810 Acute cholecystitis: Principal | ICD-10-CM

## 2023-08-13 DIAGNOSIS — R109 Unspecified abdominal pain: Secondary | ICD-10-CM | POA: Diagnosis not present

## 2023-08-13 DIAGNOSIS — K8062 Calculus of gallbladder and bile duct with acute cholecystitis without obstruction: Secondary | ICD-10-CM | POA: Diagnosis present

## 2023-08-13 DIAGNOSIS — R140 Abdominal distension (gaseous): Secondary | ICD-10-CM | POA: Diagnosis not present

## 2023-08-13 DIAGNOSIS — E876 Hypokalemia: Secondary | ICD-10-CM | POA: Insufficient documentation

## 2023-08-13 DIAGNOSIS — K5730 Diverticulosis of large intestine without perforation or abscess without bleeding: Secondary | ICD-10-CM | POA: Diagnosis present

## 2023-08-13 DIAGNOSIS — K529 Noninfective gastroenteritis and colitis, unspecified: Secondary | ICD-10-CM | POA: Diagnosis not present

## 2023-08-13 DIAGNOSIS — Z794 Long term (current) use of insulin: Secondary | ICD-10-CM

## 2023-08-13 DIAGNOSIS — M792 Neuralgia and neuritis, unspecified: Secondary | ICD-10-CM | POA: Insufficient documentation

## 2023-08-13 DIAGNOSIS — K828 Other specified diseases of gallbladder: Secondary | ICD-10-CM | POA: Diagnosis present

## 2023-08-13 DIAGNOSIS — A419 Sepsis, unspecified organism: Secondary | ICD-10-CM

## 2023-08-13 DIAGNOSIS — E785 Hyperlipidemia, unspecified: Secondary | ICD-10-CM | POA: Diagnosis present

## 2023-08-13 DIAGNOSIS — F411 Generalized anxiety disorder: Secondary | ICD-10-CM | POA: Insufficient documentation

## 2023-08-13 DIAGNOSIS — I129 Hypertensive chronic kidney disease with stage 1 through stage 4 chronic kidney disease, or unspecified chronic kidney disease: Secondary | ICD-10-CM | POA: Diagnosis present

## 2023-08-13 DIAGNOSIS — E872 Acidosis, unspecified: Secondary | ICD-10-CM | POA: Insufficient documentation

## 2023-08-13 DIAGNOSIS — K660 Peritoneal adhesions (postprocedural) (postinfection): Secondary | ICD-10-CM | POA: Diagnosis present

## 2023-08-13 DIAGNOSIS — E7800 Pure hypercholesterolemia, unspecified: Secondary | ICD-10-CM | POA: Diagnosis present

## 2023-08-13 DIAGNOSIS — E1140 Type 2 diabetes mellitus with diabetic neuropathy, unspecified: Secondary | ICD-10-CM | POA: Diagnosis present

## 2023-08-13 DIAGNOSIS — R000 Tachycardia, unspecified: Secondary | ICD-10-CM | POA: Insufficient documentation

## 2023-08-13 DIAGNOSIS — Z5329 Procedure and treatment not carried out because of patient's decision for other reasons: Secondary | ICD-10-CM | POA: Diagnosis not present

## 2023-08-13 DIAGNOSIS — N1831 Chronic kidney disease, stage 3a: Secondary | ICD-10-CM | POA: Diagnosis present

## 2023-08-13 DIAGNOSIS — Z7982 Long term (current) use of aspirin: Secondary | ICD-10-CM

## 2023-08-13 DIAGNOSIS — Z9049 Acquired absence of other specified parts of digestive tract: Secondary | ICD-10-CM

## 2023-08-13 DIAGNOSIS — N179 Acute kidney failure, unspecified: Secondary | ICD-10-CM | POA: Insufficient documentation

## 2023-08-13 DIAGNOSIS — J9811 Atelectasis: Secondary | ICD-10-CM | POA: Diagnosis present

## 2023-08-13 DIAGNOSIS — Z85048 Personal history of other malignant neoplasm of rectum, rectosigmoid junction, and anus: Secondary | ICD-10-CM

## 2023-08-13 DIAGNOSIS — E1165 Type 2 diabetes mellitus with hyperglycemia: Secondary | ICD-10-CM | POA: Diagnosis present

## 2023-08-13 DIAGNOSIS — E119 Type 2 diabetes mellitus without complications: Secondary | ICD-10-CM

## 2023-08-13 DIAGNOSIS — K219 Gastro-esophageal reflux disease without esophagitis: Secondary | ICD-10-CM | POA: Diagnosis present

## 2023-08-13 LAB — CBC
HCT: 30.5 % — ABNORMAL LOW (ref 39.0–52.0)
Hemoglobin: 10.7 g/dL — ABNORMAL LOW (ref 13.0–17.0)
MCH: 37.2 pg — ABNORMAL HIGH (ref 26.0–34.0)
MCHC: 35.1 g/dL (ref 30.0–36.0)
MCV: 105.9 fL — ABNORMAL HIGH (ref 80.0–100.0)
Platelets: 189 10*3/uL (ref 150–400)
RBC: 2.88 MIL/uL — ABNORMAL LOW (ref 4.22–5.81)
RDW: 17.2 % — ABNORMAL HIGH (ref 11.5–15.5)
WBC: 1.4 10*3/uL — CL (ref 4.0–10.5)
nRBC: 2.2 % — ABNORMAL HIGH (ref 0.0–0.2)

## 2023-08-13 LAB — BASIC METABOLIC PANEL
Anion gap: 14 (ref 5–15)
BUN: 18 mg/dL (ref 8–23)
CO2: 19 mmol/L — ABNORMAL LOW (ref 22–32)
Calcium: 8.5 mg/dL — ABNORMAL LOW (ref 8.9–10.3)
Chloride: 102 mmol/L (ref 98–111)
Creatinine, Ser: 1.39 mg/dL — ABNORMAL HIGH (ref 0.61–1.24)
GFR, Estimated: 56 mL/min — ABNORMAL LOW (ref >60)
Glucose, Bld: 201 mg/dL — ABNORMAL HIGH (ref 70–99)
Potassium: 3.2 mmol/L — ABNORMAL LOW (ref 3.5–5.1)
Sodium: 135 mmol/L (ref 135–145)

## 2023-08-13 LAB — I-STAT CG4 LACTIC ACID, ED
Lactic Acid, Venous: 2.6 mmol/L (ref 0.5–1.9)
Lactic Acid, Venous: 1.5 mmol/L (ref 0.5–1.9)

## 2023-08-13 LAB — TROPONIN I (HIGH SENSITIVITY)
Troponin I (High Sensitivity): 10 ng/L (ref <18)
Troponin I (High Sensitivity): 14 ng/L (ref <18)

## 2023-08-13 LAB — PROTIME-INR
INR: 1.2 (ref 0.8–1.2)
Prothrombin Time: 15.9 s — ABNORMAL HIGH (ref 11.4–15.2)

## 2023-08-13 LAB — RESP PANEL BY RT-PCR (RSV, FLU A&B, COVID)  RVPGX2
Influenza A by PCR: NEGATIVE
Influenza B by PCR: NEGATIVE
Resp Syncytial Virus by PCR: NEGATIVE
SARS Coronavirus 2 by RT PCR: NEGATIVE

## 2023-08-13 LAB — APTT: aPTT: 26 s (ref 24–36)

## 2023-08-13 MED ORDER — VANCOMYCIN HCL IN DEXTROSE 1-5 GM/200ML-% IV SOLN
1000.0000 mg | Freq: Once | INTRAVENOUS | Status: AC
  Administered 2023-08-14: 1000 mg via INTRAVENOUS
  Filled 2023-08-13: qty 200

## 2023-08-13 MED ORDER — ACETAMINOPHEN 325 MG PO TABS
325.0000 mg | ORAL_TABLET | Freq: Once | ORAL | Status: AC
  Administered 2023-08-14: 325 mg via ORAL
  Filled 2023-08-13: qty 1

## 2023-08-13 MED ORDER — LACTATED RINGERS IV BOLUS
1000.0000 mL | Freq: Once | INTRAVENOUS | Status: AC
  Administered 2023-08-13: 1000 mL via INTRAVENOUS

## 2023-08-13 MED ORDER — LACTATED RINGERS IV SOLN: INTRAVENOUS | Status: DC

## 2023-08-13 MED ORDER — METRONIDAZOLE 500 MG/100ML IV SOLN
500.0000 mg | Freq: Once | INTRAVENOUS | Status: AC
  Administered 2023-08-13: 500 mg via INTRAVENOUS
  Filled 2023-08-13: qty 100

## 2023-08-13 MED ORDER — SODIUM CHLORIDE 0.9 % IV SOLN
2.0000 g | Freq: Once | INTRAVENOUS | Status: AC
  Administered 2023-08-13: 2 g via INTRAVENOUS
  Filled 2023-08-13: qty 12.5

## 2023-08-13 MED ORDER — ACETAMINOPHEN 325 MG PO TABS
650.0000 mg | ORAL_TABLET | Freq: Once | ORAL | Status: AC | PRN
  Administered 2023-08-13: 650 mg via ORAL
  Filled 2023-08-13: qty 2

## 2023-08-13 NOTE — Sepsis Progress Note (Addendum)
 Elink monitoring for the code sepsis protocol.  Notified provider of need to order antibiotics.  Per L. Edyth Gunnels, PA, awaiting test results to see the source, viral or not.

## 2023-08-13 NOTE — ED Provider Notes (Signed)
 [place] EMERGENCY DEPARTMENT AT [facility] Provider Note   CSN: [id number] Arrival date & time: 08/13/23  1847     History {Add pertinent medical, surgical, social history, OB history to HPI:1} Chief Complaint  Patient presents with   Abdominal Pain    [name] is a 68 y.o. male with past medical history of chronic hep C, HLD, substance abuse, HTN, GERD, GIB, colorectal cancer in remission presents to emergency department for evaluation of midsternal sharp chest pain with associated vomiting and abdominal pain that started this morning.  He endorses that he vomited 20 times today. Last BM was today and normal. Denies diarrhea, hematochezia, melena, fevers, known sick contacts   Poor historian and was frustrated with "repetitive questions".    Abdominal Pain Associated symptoms: chest pain   Associated symptoms: no chills, no constipation, no cough, no diarrhea, no fatigue, no fever, no nausea, no shortness of breath and no vomiting        Home Medications Prior to Admission medications   Medication Sig Start Date End Date Taking? Authorizing Provider  amLODipine (NORVASC) 10 MG tablet Take 10 mg by mouth daily.    [provider]  aspirin EC 81 MG tablet Take 81 mg by mouth daily.    [provider]  atorvastatin (LIPITOR) 20 MG tablet Take 20 mg by mouth daily.    [provider]  Cholestyramine POWD Take 1 scoop by mouth daily. MIXED IN 6 OUNCES OF WATER OR JUICE    [provider]  dibucaine (NUPERCAINAL) 1 % ointment Apply 1 application topically daily as needed (for anal pain).     [provider]  escitalopram (LEXAPRO) 20 MG tablet Take 20 mg by mouth daily.    [provider]  gentamicin cream (GARAMYCIN) 0.1 % Apply 1 application topically daily as needed (to affected sites).  Patient not taking: Reported on 01/28/2023    [provider]  hydrocortisone cream 1 % Apply 1 application  topically 2 (two) times daily as needed for itching.    [provider]  hydrOXYzine (VISTARIL) 25 MG capsule Take by mouth. 07/16/22   [provider]  insulin glargine-yfgn (SEMGLEE) 100 UNIT/ML injection Inject into the skin. 04/22/22   [provider]  LACTOBACILLUS PO Take 2 tablets by mouth 2 (two) times daily.    [provider]  loperamide (IMODIUM) 2 MG capsule Take by mouth. 07/02/22   [provider]  losartan (COZAAR) 100 MG tablet Take 100 mg by mouth daily.    [provider]  meloxicam (MOBIC) 15 MG tablet Take 15 mg by mouth daily as needed (for foot pain). Patient not taking: Reported on 01/28/2023    [provider]  metFORMIN (GLUCOPHAGE-XR) 750 MG 24 hr tablet Take 1,500 mg by mouth daily.    [provider]  metoprolol tartrate (LOPRESSOR) 50 MG tablet Take by mouth. 01/14/23   [provider]  mirtazapine (REMERON) 15 MG tablet Take 15 mg by mouth at bedtime.    [provider]  polyethylene glycol powder (GLYCOLAX/MIRALAX) 17 GM/SCOOP powder Take 255 g by mouth daily. 01/28/23   [name], MD  prazosin (MINIPRESS) 1 MG capsule Take by mouth. 07/16/22   [provider]  psyllium (METAMUCIL) 58.6 % powder Take 2 packets by mouth every morning. MIXED IN 8 OUNCES OF WATER OR JUICE Patient not taking: Reported on 01/28/2023    [provider]  ribavirin (COPEGUS) 200 MG tablet  Take 3 tablets (600 mg total) by mouth 2 (two) times daily. Patient not taking: Reported on 10/17/2016 07/13/14   [name], MD  sildenafil (VIAGRA) 25 MG tablet Take 25 mg by mouth daily as needed for erectile dysfunction.    [provider]  simethicone (MYLICON) 80 MG chewable tablet Chew 80 mg by mouth 2 (two) times daily as needed for flatulence.    [provider]  Sofosbuvir 400 MG TABS Take 1 tablet by mouth daily. Patient not taking: Reported on 10/17/2016 07/13/14    [name], MD  traZODone (DESYREL) 100 MG tablet Take 50-200 mg by mouth at bedtime as needed (for insomnia).    [provider]      Allergies    Patient has no known allergies.    Review of Systems   Review of Systems  Constitutional:  Negative for chills, fatigue and fever.  Respiratory:  Negative for cough, shortness of breath and wheezing.   Cardiovascular:  Positive for chest pain. Negative for palpitations.  Gastrointestinal:  Positive for abdominal pain. Negative for constipation, diarrhea, nausea and vomiting.  Neurological:  Negative for dizziness, seizures, weakness, light-headedness, numbness and headaches.    Physical Exam Updated Vital Signs BP (!) 176/88   Pulse (!) 130   Temp (!) 101.7 F (38.7 C)   Resp (!) 27   Ht 6\' 1"  (1.854 m)   Wt 100.7 kg   SpO2 95%   BMI 29.29 kg/m  Physical Exam Vitals and nursing note reviewed.  Constitutional:      General: He is not in acute distress.    Appearance: Normal appearance.  HENT:     Head: Normocephalic and atraumatic.  Eyes:     Conjunctiva/sclera: Conjunctivae normal.  Cardiovascular:     Rate and Rhythm: Tachycardia present.  Pulmonary:     Effort: Pulmonary effort is normal. No respiratory distress.     Breath sounds: Normal breath sounds.  Abdominal:     General: Abdomen is protuberant. Bowel sounds are normal.     Tenderness: There is generalized abdominal tenderness (with light palpation). There is guarding. There is no right CVA tenderness or left CVA tenderness.  Musculoskeletal:     Right lower leg: No edema.     Left lower leg: No edema.  Skin:    Coloration: Skin is not jaundiced or pale.  Neurological:     Mental Status: He is alert and oriented to person, place, and time. Mental status is at baseline.     ED Results / Procedures / Treatments   Labs (all labs ordered are listed, but only abnormal results are displayed) Labs Reviewed  BASIC METABOLIC PANEL - Abnormal; Notable  for the following components:      Result Value   Potassium 3.2 (*)    CO2 19 (*)    Glucose, Bld 201 (*)    Creatinine, Ser 1.39 (*)    Calcium 8.5 (*)    GFR, Estimated 56 (*)    All other components within normal limits  CBC - Abnormal; Notable for the following components:   WBC 1.4 (*)    RBC 2.88 (*)    Hemoglobin 10.7 (*)    HCT 30.5 (*)    MCV 105.9 (*)    MCH 37.2 (*)    RDW 17.2 (*)    nRBC 2.2 (*)    All other components within normal limits  I-STAT CG4 LACTIC ACID, ED - Abnormal; Notable for the following components:  Lactic Acid, Venous 2.6 (*)    All other components within normal limits  TROPONIN I (HIGH SENSITIVITY)    EKG None  Radiology DG Chest 2 View Result Date: 08/13/2023 CLINICAL DATA:  Short of breath, epigastric pain, vomiting EXAM: CHEST - 2 VIEW COMPARISON:  06/28/2005 FINDINGS: The heart size and mediastinal contours are within normal limits. Both lungs are clear. The visualized skeletal structures are unremarkable. IMPRESSION: No active cardiopulmonary disease. Electronically Signed   By: Sharlet Salina M.D.   On: 08/13/2023 19:37    Procedures Procedures  {Document cardiac monitor, telemetry assessment procedure when appropriate:1}  Medications Ordered in ED Medications  acetaminophen (TYLENOL) tablet 650 mg (650 mg Oral Given 08/13/23 2012)    ED Course/ Medical Decision Making/ A&P   {   Click here for ABCD2, HEART and other calculatorsREFRESH Note before signing :1}                              Medical Decision Making Amount and/or Complexity of Data Reviewed Labs: ordered. Radiology: ordered.  Risk OTC drugs. Prescription drug management.   ***  {Document critical care time when appropriate:1} {Document review of labs and clinical decision tools ie heart score, Chads2Vasc2 etc:1}  {Document your independent review of radiology images, and any outside records:1} {Document your discussion with family members, caretakers,  and with consultants:1} {Document social determinants of health affecting pt's care:1} {Document your decision making why or why not admission, treatments were needed:1} Final Clinical Impression(s) / ED Diagnoses Final diagnoses:  None    Rx / DC Orders ED Discharge Orders     None

## 2023-08-13 NOTE — ED Triage Notes (Signed)
 Pt BIB by EMS for sudden onset of epigastric pain and vomiting. Restless upon EMS arrival. Pt pale on arrival with tachycardia noted. Tenderness upon light palpation and possible ABD distention. Pt appears more calm after pain medication by EMS.  Initial VS BP 172/86 HR 133 T 101.7

## 2023-08-14 ENCOUNTER — Inpatient Hospital Stay (HOSPITAL_COMMUNITY): Payer: No Typology Code available for payment source

## 2023-08-14 ENCOUNTER — Emergency Department (HOSPITAL_COMMUNITY): Payer: No Typology Code available for payment source

## 2023-08-14 ENCOUNTER — Encounter (HOSPITAL_COMMUNITY): Payer: Self-pay | Admitting: Internal Medicine

## 2023-08-14 DIAGNOSIS — B182 Chronic viral hepatitis C: Secondary | ICD-10-CM | POA: Diagnosis present

## 2023-08-14 DIAGNOSIS — K529 Noninfective gastroenteritis and colitis, unspecified: Secondary | ICD-10-CM | POA: Diagnosis not present

## 2023-08-14 DIAGNOSIS — F418 Other specified anxiety disorders: Secondary | ICD-10-CM | POA: Diagnosis not present

## 2023-08-14 DIAGNOSIS — K8062 Calculus of gallbladder and bile duct with acute cholecystitis without obstruction: Secondary | ICD-10-CM | POA: Diagnosis present

## 2023-08-14 DIAGNOSIS — R000 Tachycardia, unspecified: Secondary | ICD-10-CM | POA: Diagnosis present

## 2023-08-14 DIAGNOSIS — K8042 Calculus of bile duct with acute cholecystitis without obstruction: Secondary | ICD-10-CM

## 2023-08-14 DIAGNOSIS — I2510 Atherosclerotic heart disease of native coronary artery without angina pectoris: Secondary | ICD-10-CM | POA: Diagnosis not present

## 2023-08-14 DIAGNOSIS — K8020 Calculus of gallbladder without cholecystitis without obstruction: Secondary | ICD-10-CM | POA: Diagnosis not present

## 2023-08-14 DIAGNOSIS — E872 Acidosis, unspecified: Secondary | ICD-10-CM

## 2023-08-14 DIAGNOSIS — J9811 Atelectasis: Secondary | ICD-10-CM | POA: Diagnosis present

## 2023-08-14 DIAGNOSIS — E1140 Type 2 diabetes mellitus with diabetic neuropathy, unspecified: Secondary | ICD-10-CM | POA: Diagnosis present

## 2023-08-14 DIAGNOSIS — R7989 Other specified abnormal findings of blood chemistry: Secondary | ICD-10-CM | POA: Diagnosis not present

## 2023-08-14 DIAGNOSIS — I129 Hypertensive chronic kidney disease with stage 1 through stage 4 chronic kidney disease, or unspecified chronic kidney disease: Secondary | ICD-10-CM | POA: Diagnosis present

## 2023-08-14 DIAGNOSIS — K810 Acute cholecystitis: Principal | ICD-10-CM

## 2023-08-14 DIAGNOSIS — E7800 Pure hypercholesterolemia, unspecified: Secondary | ICD-10-CM | POA: Diagnosis present

## 2023-08-14 DIAGNOSIS — E860 Dehydration: Secondary | ICD-10-CM | POA: Diagnosis present

## 2023-08-14 DIAGNOSIS — N179 Acute kidney failure, unspecified: Secondary | ICD-10-CM | POA: Insufficient documentation

## 2023-08-14 DIAGNOSIS — M792 Neuralgia and neuritis, unspecified: Secondary | ICD-10-CM

## 2023-08-14 DIAGNOSIS — E1165 Type 2 diabetes mellitus with hyperglycemia: Secondary | ICD-10-CM | POA: Diagnosis present

## 2023-08-14 DIAGNOSIS — F411 Generalized anxiety disorder: Secondary | ICD-10-CM

## 2023-08-14 DIAGNOSIS — Z5329 Procedure and treatment not carried out because of patient's decision for other reasons: Secondary | ICD-10-CM | POA: Diagnosis not present

## 2023-08-14 DIAGNOSIS — K82A1 Gangrene of gallbladder in cholecystitis: Secondary | ICD-10-CM | POA: Diagnosis present

## 2023-08-14 DIAGNOSIS — E876 Hypokalemia: Secondary | ICD-10-CM

## 2023-08-14 DIAGNOSIS — Z794 Long term (current) use of insulin: Secondary | ICD-10-CM | POA: Diagnosis not present

## 2023-08-14 DIAGNOSIS — E7849 Other hyperlipidemia: Secondary | ICD-10-CM

## 2023-08-14 DIAGNOSIS — R1011 Right upper quadrant pain: Secondary | ICD-10-CM | POA: Diagnosis not present

## 2023-08-14 DIAGNOSIS — A419 Sepsis, unspecified organism: Secondary | ICD-10-CM

## 2023-08-14 DIAGNOSIS — A4159 Other Gram-negative sepsis: Secondary | ICD-10-CM | POA: Diagnosis present

## 2023-08-14 DIAGNOSIS — B961 Klebsiella pneumoniae [K. pneumoniae] as the cause of diseases classified elsewhere: Secondary | ICD-10-CM | POA: Diagnosis present

## 2023-08-14 DIAGNOSIS — K660 Peritoneal adhesions (postprocedural) (postinfection): Secondary | ICD-10-CM | POA: Diagnosis present

## 2023-08-14 DIAGNOSIS — K8040 Calculus of bile duct with cholecystitis, unspecified, without obstruction: Secondary | ICD-10-CM | POA: Diagnosis not present

## 2023-08-14 DIAGNOSIS — F32A Depression, unspecified: Secondary | ICD-10-CM | POA: Diagnosis present

## 2023-08-14 DIAGNOSIS — E1122 Type 2 diabetes mellitus with diabetic chronic kidney disease: Secondary | ICD-10-CM | POA: Diagnosis present

## 2023-08-14 DIAGNOSIS — I10 Essential (primary) hypertension: Secondary | ICD-10-CM | POA: Diagnosis not present

## 2023-08-14 DIAGNOSIS — K828 Other specified diseases of gallbladder: Secondary | ICD-10-CM | POA: Diagnosis present

## 2023-08-14 DIAGNOSIS — R140 Abdominal distension (gaseous): Secondary | ICD-10-CM | POA: Diagnosis not present

## 2023-08-14 DIAGNOSIS — E119 Type 2 diabetes mellitus without complications: Secondary | ICD-10-CM

## 2023-08-14 DIAGNOSIS — K8050 Calculus of bile duct without cholangitis or cholecystitis without obstruction: Secondary | ICD-10-CM | POA: Diagnosis not present

## 2023-08-14 DIAGNOSIS — G4700 Insomnia, unspecified: Secondary | ICD-10-CM

## 2023-08-14 DIAGNOSIS — K219 Gastro-esophageal reflux disease without esophagitis: Secondary | ICD-10-CM

## 2023-08-14 DIAGNOSIS — R109 Unspecified abdominal pain: Secondary | ICD-10-CM | POA: Diagnosis present

## 2023-08-14 DIAGNOSIS — N1831 Chronic kidney disease, stage 3a: Secondary | ICD-10-CM | POA: Diagnosis present

## 2023-08-14 LAB — BLOOD CULTURE ID PANEL (REFLEXED) - BCID2

## 2023-08-14 LAB — CBC
HCT: 28.1 % — ABNORMAL LOW (ref 39.0–52.0)
Hemoglobin: 9.9 g/dL — ABNORMAL LOW (ref 13.0–17.0)
MCH: 37.1 pg — ABNORMAL HIGH (ref 26.0–34.0)
MCHC: 35.2 g/dL (ref 30.0–36.0)
MCV: 105.2 fL — ABNORMAL HIGH (ref 80.0–100.0)
Platelets: 167 10*3/uL (ref 150–400)
RBC: 2.67 MIL/uL — ABNORMAL LOW (ref 4.22–5.81)
RDW: 17.7 % — ABNORMAL HIGH (ref 11.5–15.5)
WBC: 5.6 10*3/uL (ref 4.0–10.5)
nRBC: 0 % (ref 0.0–0.2)

## 2023-08-14 LAB — HEPATIC FUNCTION PANEL
ALT: 36 U/L (ref 0–44)
AST: 117 U/L — ABNORMAL HIGH (ref 15–41)
Albumin: 3.2 g/dL — ABNORMAL LOW (ref 3.5–5.0)
Alkaline Phosphatase: 93 U/L (ref 38–126)
Bilirubin, Direct: 5.9 mg/dL — ABNORMAL HIGH (ref 0.0–0.2)
Indirect Bilirubin: 2.9 mg/dL — ABNORMAL HIGH (ref 0.3–0.9)
Total Bilirubin: 8.8 mg/dL — ABNORMAL HIGH (ref 0.0–1.2)
Total Protein: 6.3 g/dL — ABNORMAL LOW (ref 6.5–8.1)

## 2023-08-14 LAB — COMPREHENSIVE METABOLIC PANEL
ALT: 38 U/L (ref 0–44)
AST: 107 U/L — ABNORMAL HIGH (ref 15–41)
Albumin: 3.2 g/dL — ABNORMAL LOW (ref 3.5–5.0)
Alkaline Phosphatase: 92 U/L (ref 38–126)
Anion gap: 15 (ref 5–15)
BUN: 21 mg/dL (ref 8–23)
CO2: 18 mmol/L — ABNORMAL LOW (ref 22–32)
Calcium: 8.2 mg/dL — ABNORMAL LOW (ref 8.9–10.3)
Chloride: 100 mmol/L (ref 98–111)
Creatinine, Ser: 1.92 mg/dL — ABNORMAL HIGH (ref 0.61–1.24)
GFR, Estimated: 38 mL/min — ABNORMAL LOW (ref >60)
Glucose, Bld: 228 mg/dL — ABNORMAL HIGH (ref 70–99)
Potassium: 4 mmol/L (ref 3.5–5.1)
Sodium: 133 mmol/L — ABNORMAL LOW (ref 135–145)
Total Bilirubin: 8.6 mg/dL — ABNORMAL HIGH (ref 0.0–1.2)
Total Protein: 6.5 g/dL (ref 6.5–8.1)

## 2023-08-14 LAB — CBC WITH DIFFERENTIAL/PLATELET
Abs Immature Granulocytes: 0 10*3/uL (ref 0.00–0.07)
Basophils Absolute: 0 10*3/uL (ref 0.0–0.1)
Basophils Relative: 0 %
Eosinophils Absolute: 0 10*3/uL (ref 0.0–0.5)
Eosinophils Relative: 0 %
HCT: 26.9 % — ABNORMAL LOW (ref 39.0–52.0)
Hemoglobin: 9.4 g/dL — ABNORMAL LOW (ref 13.0–17.0)
Lymphocytes Relative: 3 %
Lymphs Abs: 0.1 10*3/uL — ABNORMAL LOW (ref 0.7–4.0)
MCH: 36.9 pg — ABNORMAL HIGH (ref 26.0–34.0)
MCHC: 34.9 g/dL (ref 30.0–36.0)
MCV: 105.5 fL — ABNORMAL HIGH (ref 80.0–100.0)
Monocytes Absolute: 0 10*3/uL — ABNORMAL LOW (ref 0.1–1.0)
Monocytes Relative: 0 %
Neutro Abs: 3.9 10*3/uL (ref 1.7–7.7)
Neutrophils Relative %: 97 %
Platelets: 164 10*3/uL (ref 150–400)
RBC: 2.55 MIL/uL — ABNORMAL LOW (ref 4.22–5.81)
RDW: 17.4 % — ABNORMAL HIGH (ref 11.5–15.5)
WBC: 4 10*3/uL (ref 4.0–10.5)
nRBC: 0 % (ref 0.0–0.2)
nRBC: 0 /100{WBCs}

## 2023-08-14 LAB — GLUCOSE, CAPILLARY
Glucose-Capillary: 112 mg/dL — ABNORMAL HIGH (ref 70–99)
Glucose-Capillary: 189 mg/dL — ABNORMAL HIGH (ref 70–99)
Glucose-Capillary: 92 mg/dL (ref 70–99)

## 2023-08-14 LAB — CBG MONITORING, ED
Glucose-Capillary: 214 mg/dL — ABNORMAL HIGH (ref 70–99)
Glucose-Capillary: 125 mg/dL — ABNORMAL HIGH (ref 70–99)

## 2023-08-14 LAB — HIV ANTIBODY (ROUTINE TESTING W REFLEX): HIV Screen 4th Generation wRfx: NONREACTIVE

## 2023-08-14 LAB — LIPASE, BLOOD: Lipase: 29 U/L (ref 11–51)

## 2023-08-14 MED ORDER — LORAZEPAM 2 MG/ML IJ SOLN
0.5000 mg | Freq: Once | INTRAMUSCULAR | Status: AC
  Administered 2023-08-14: 0.5 mg via INTRAVENOUS
  Filled 2023-08-14: qty 1

## 2023-08-14 MED ORDER — MORPHINE SULFATE (PF) 2 MG/ML IV SOLN
2.0000 mg | INTRAVENOUS | Status: DC | PRN
  Administered 2023-08-14 – 2023-08-15 (×5): 2 mg via INTRAVENOUS
  Filled 2023-08-14 (×5): qty 1

## 2023-08-14 MED ORDER — SODIUM CHLORIDE 0.9% FLUSH: 3.0000 mL | INTRAVENOUS | Status: DC | PRN

## 2023-08-14 MED ORDER — TRAZODONE HCL 50 MG PO TABS: 100.0000 mg | ORAL_TABLET | Freq: Every evening | ORAL | Status: DC | PRN

## 2023-08-14 MED ORDER — LACTATED RINGERS IV BOLUS
2000.0000 mL | Freq: Once | INTRAVENOUS | Status: AC
  Administered 2023-08-14: 2000 mL via INTRAVENOUS

## 2023-08-14 MED ORDER — METRONIDAZOLE 500 MG/100ML IV SOLN
500.0000 mg | Freq: Two times a day (BID) | INTRAVENOUS | Status: DC
  Administered 2023-08-14 – 2023-08-15 (×3): 500 mg via INTRAVENOUS
  Filled 2023-08-14 (×3): qty 100

## 2023-08-14 MED ORDER — METHOCARBAMOL 750 MG PO TABS: 750.0000 mg | ORAL_TABLET | Freq: Every day | ORAL | Status: DC | PRN

## 2023-08-14 MED ORDER — ONDANSETRON HCL 4 MG/2ML IJ SOLN
4.0000 mg | Freq: Once | INTRAMUSCULAR | Status: AC
  Administered 2023-08-14: 4 mg via INTRAVENOUS
  Filled 2023-08-14: qty 2

## 2023-08-14 MED ORDER — INSULIN ASPART 100 UNIT/ML IJ SOLN
0.0000 [IU] | INTRAMUSCULAR | Status: DC
  Administered 2023-08-14: 1 [IU] via SUBCUTANEOUS
  Administered 2023-08-14: 2 [IU] via SUBCUTANEOUS
  Administered 2023-08-14: 3 [IU] via SUBCUTANEOUS
  Administered 2023-08-15: 5 [IU] via SUBCUTANEOUS
  Administered 2023-08-15: 3 [IU] via SUBCUTANEOUS
  Administered 2023-08-16: 2 [IU] via SUBCUTANEOUS
  Administered 2023-08-16: 5 [IU] via SUBCUTANEOUS
  Administered 2023-08-16 – 2023-08-17 (×5): 2 [IU] via SUBCUTANEOUS
  Administered 2023-08-18: 1 [IU] via SUBCUTANEOUS
  Administered 2023-08-18: 2 [IU] via SUBCUTANEOUS

## 2023-08-14 MED ORDER — POTASSIUM CHLORIDE 10 MEQ/100ML IV SOLN
10.0000 meq | INTRAVENOUS | Status: AC
  Administered 2023-08-14 (×3): 10 meq via INTRAVENOUS
  Filled 2023-08-14 (×3): qty 100

## 2023-08-14 MED ORDER — HYDROXYZINE HCL 25 MG PO TABS: 25.0000 mg | ORAL_TABLET | Freq: Four times a day (QID) | ORAL | Status: DC | PRN

## 2023-08-14 MED ORDER — ACETAMINOPHEN 650 MG RE SUPP: 650.0000 mg | Freq: Four times a day (QID) | RECTAL | Status: DC | PRN

## 2023-08-14 MED ORDER — MORPHINE SULFATE (PF) 4 MG/ML IV SOLN
4.0000 mg | Freq: Once | INTRAVENOUS | Status: AC
  Administered 2023-08-14: 4 mg via INTRAVENOUS
  Filled 2023-08-14: qty 1

## 2023-08-14 MED ORDER — LACTATED RINGERS IV SOLN
INTRAVENOUS | Status: AC
  Administered 2023-08-14: 150 mL via INTRAVENOUS

## 2023-08-14 MED ORDER — ONDANSETRON HCL 4 MG PO TABS: 4.0000 mg | ORAL_TABLET | Freq: Four times a day (QID) | ORAL | Status: DC | PRN

## 2023-08-14 MED ORDER — GABAPENTIN 300 MG PO CAPS: 300.0000 mg | ORAL_CAPSULE | Freq: Every evening | ORAL | Status: DC | PRN

## 2023-08-14 MED ORDER — ACETAMINOPHEN 325 MG PO TABS
650.0000 mg | ORAL_TABLET | Freq: Four times a day (QID) | ORAL | Status: DC | PRN
  Administered 2023-08-14: 650 mg via ORAL
  Filled 2023-08-14: qty 2

## 2023-08-14 MED ORDER — ESCITALOPRAM OXALATE 10 MG PO TABS
20.0000 mg | ORAL_TABLET | Freq: Every day | ORAL | Status: DC
  Administered 2023-08-14 – 2023-08-18 (×5): 20 mg via ORAL
  Filled 2023-08-14 (×5): qty 2

## 2023-08-14 MED ORDER — GADOBUTROL 1 MMOL/ML IV SOLN
10.0000 mL | Freq: Once | INTRAVENOUS | Status: AC | PRN
  Administered 2023-08-14: 10 mL via INTRAVENOUS

## 2023-08-14 MED ORDER — METOPROLOL TARTRATE 25 MG PO TABS
25.0000 mg | ORAL_TABLET | Freq: Two times a day (BID) | ORAL | Status: DC
  Administered 2023-08-14 – 2023-08-18 (×9): 25 mg via ORAL
  Filled 2023-08-14 (×9): qty 1

## 2023-08-14 MED ORDER — ONDANSETRON HCL 4 MG/2ML IJ SOLN
4.0000 mg | Freq: Four times a day (QID) | INTRAMUSCULAR | Status: DC | PRN
Start: 2023-08-14 — End: 2023-08-18
  Administered 2023-08-15: 4 mg via INTRAVENOUS

## 2023-08-14 MED ORDER — MIRTAZAPINE 15 MG PO TABS
15.0000 mg | ORAL_TABLET | Freq: Every day | ORAL | Status: DC
  Administered 2023-08-14 – 2023-08-17 (×4): 15 mg via ORAL
  Filled 2023-08-14 (×4): qty 1

## 2023-08-14 MED ORDER — SODIUM CHLORIDE 0.9 % IV SOLN: 250.0000 mL | INTRAVENOUS | Status: AC | PRN

## 2023-08-14 MED ORDER — SODIUM CHLORIDE 0.9 % IV SOLN
2.0000 g | INTRAVENOUS | Status: DC
  Administered 2023-08-14 – 2023-08-17 (×4): 2 g via INTRAVENOUS
  Filled 2023-08-14 (×4): qty 20

## 2023-08-14 MED ORDER — SODIUM CHLORIDE 0.9% FLUSH
3.0000 mL | Freq: Two times a day (BID) | INTRAVENOUS | Status: DC
  Administered 2023-08-14 – 2023-08-18 (×8): 3 mL via INTRAVENOUS

## 2023-08-14 MED ORDER — DICLOFENAC SUPPOSITORY 100 MG: 100.0000 mg | Freq: Once | RECTAL | Status: DC

## 2023-08-14 MED ORDER — HEPARIN SODIUM (PORCINE) 5000 UNIT/ML IJ SOLN
5000.0000 [IU] | Freq: Three times a day (TID) | INTRAMUSCULAR | Status: AC
  Administered 2023-08-14 (×2): 5000 [IU] via SUBCUTANEOUS
  Filled 2023-08-14 (×2): qty 1

## 2023-08-14 MED ORDER — IOHEXOL 350 MG/ML SOLN
75.0000 mL | Freq: Once | INTRAVENOUS | Status: AC | PRN
  Administered 2023-08-14: 75 mL via INTRAVENOUS

## 2023-08-14 NOTE — ED Provider Notes (Signed)
  Physical Exam  BP 133/77   Pulse (!) 131   Temp (!) 100.4 F (38 C) (Oral)   Resp 20   Ht 6\' 1"  (1.854 m)   Wt 100.7 kg   SpO2 93%   BMI 29.29 kg/m   Physical Exam  Procedures  .Critical Care  Performed by: Darrick Grinder, PA-C Authorized by: Darrick Grinder, PA-C   Critical care provider statement:    Critical care time (minutes):  30   Critical care time was exclusive of:  Separately billable procedures and treating other patients   Critical care was necessary to treat or prevent imminent or life-threatening deterioration of the following conditions:  Sepsis   Critical care was time spent personally by me on the following activities:  Development of treatment plan with patient or surrogate, discussions with consultants, evaluation of patient's response to treatment, examination of patient, ordering and review of laboratory studies, ordering and review of radiographic studies, ordering and performing treatments and interventions, pulse oximetry, re-evaluation of patient's condition and review of old charts   Care discussed with: admitting provider     ED Course / MDM    Medical Decision Making Amount and/or Complexity of Data Reviewed Labs: ordered. Radiology: ordered.  Risk OTC drugs. Prescription drug management. Decision regarding hospitalization.   Patient care assumed at shift handoff from previous provider.  See her note for full details.  In short, patient with past medical history of chronic hepatitis C, substance abuse, hypertension, GERD, colorectal cancer in remission presented to the emergency department complaining of abdominal and chest pain that began this morning with associated vomiting.  He reports vomiting approximately 20 times throughout the day.  Abdominal exam with significant generalized tenderness with light palpation and guarding.  Previous provider was unable to localize pain on abdominal exam.  At time of shift handoff code sepsis had been  activated and broad-spectrum antibiotics and lactated Ringer's bolus has been initiated.  Patient has remained febrile and tachycardic.  Disposition pending results of CT chest abdomen pelvis with contrast. Cholelithiasis. A few small stones noted within the cystic duct and  common bile duct. Mild gallbladder wall thickening. Findings  concerning for possible acute cholecystitis.    Rectal wall thickening with surrounding inflammation concerning for  proctitis.    Colonic diverticulosis.    Dependent atelectasis in the lower lobes.    Coronary artery disease, aortic atherosclerosis.    I noted that the initial labs included a basic metabolic panel and a CBC without differential.  Patient is leukopenic with a white count of 1400.  CBC with differential ordered.  I also ordered a hepatic function panel.  I requested consultation with general surgery, Dr. Azucena Cecil.  Recommends MRCP and GI consult.  Secure chat sent to Dr.McGreal with [facility] to have patient added to rounding list.  MRCP ordered.  Medicine consulted.  I discussed the case with Dr.Sundil agrees to see the patient for admission       [name] 08/14/23 0421    [name], MD 08/14/23 [phone]

## 2023-08-14 NOTE — Progress Notes (Signed)
 Admitted early morning hours by nighttime hospitalist.  In brief, 68 year old with history of rectal adenocarcinoma treated with excision and chemo, insulin-dependent type 2 diabetes, chronic hepatitis C infection, GERD and hyperlipidemia presented with multiple episodes of vomiting and epigastric pain.  He was tachycardic, temperature 101.7 with a stable blood pressures in the ER.  On room air.  CT scan consistent with cholelithiasis and cholecystitis.  Bilirubin 5.  MRCP positive for choledocholithiasis.  Started on broad-spectrum antibiotics after drawing blood cultures.  GI and surgery consulted.  Patient is a scheduled to undergo ERCP today.  N.p.o., IV fluids, antibiotics, adequate pain medications.  ERCP.  Likely lap chole before discharge.   Patient seen and examined.  He has mild epigastric pain but denies any other complaints.   No charge visit.  Same-day admit.

## 2023-08-14 NOTE — Consult Note (Signed)
 Reason for Consult/Chief Complaint:  Choledocholithiasis, possible cholecystitis Referring Provider: Marcelline Deist, PA-C  HPI  [name] is an 68 y.o. male with history of chronic hepatitis C, HLD, substance abuse, HTN, GERD, GIB, colorectal cancer s/p LAR who presented to ED for sharp chest pain with associated emesis and abdominal pain.  Patient states this all started Wednesday morning. He states he has vomited 20 times since the pain start. Patient had normal BM today. No diarrhea, no fevers/chills, denies recent sick contacts.   Patient states he has never had pain like this in the past.   CT scan notable for cholelithiasis as well as choledocholithiasis and mild gallbladder wall thickening.  Patients labs notable for leukopenia to 1.4 as well as anemia 10.7/30.5. Patient hypokalemic to 3.2 and elevated creatinine of 1.39. Lactic acid initially elevated to 2.6 but cleared as most recently is 1.5. Lipase normal. Total bilirubin elevated to 8.8.  10 point review of systems is negative except as listed above in HPI.  Objective  Past Medical History: Past Medical History:  Diagnosis Date   Cancer (HCC)    colon   Depression    Diabetes mellitus without complication (HCC)    Hallucinations    Hypercholesteremia    Hypertension    PTSD (post-traumatic stress disorder)    Suicidal ideation     Past Surgical History: Past Surgical History:  Procedure Laterality Date   colon rectal surgery     2014   COLONOSCOPY     CORONARY ANGIOPLASTY WITH STENT PLACEMENT      Family History:  Family History  Problem Relation Age of Onset   Colon cancer Neg Hx    Colon polyps Neg Hx    Esophageal cancer Neg Hx    Rectal cancer Neg Hx    Stomach cancer Neg Hx     Social History:  reports that he has never smoked. He does not have any smokeless tobacco history on file. He reports current alcohol use of about 4.0 standard drinks of alcohol per week. He reports that he does not  currently use drugs after having used the following drugs: Codeine.  Allergies: No Known Allergies  Medications: I have reviewed the patient's current medications.  Labs: I have personally reviewed all labs for the past 24h  Imaging: I have personally reviewed and interpreted all imaging for the past 24h and agree with the radiologist's impression.   Physical Exam Blood pressure 138/83, pulse (!) 120, temperature (!) 100.4 F (38 C), temperature source Oral, resp. rate 16, height 6\' 1"  (1.854 m), weight 100.7 kg, SpO2 98%. Constitutional: well-developed, well-nourished HEENT: pupils equal, round, reactive to light, moist conjunctiva, hearing intact Oropharynx: mucous membranes dry CV: Sinus tachycardia, normotensive Chest: equal chest rise bilaterally normal respiratory effort on room air Abdomen: soft, nondistended, tender to palpation in epigastrium and RUQ. Extremities: moves all extremities Skin: warm, dry, no rashes Psych: normal memory, normal mood/affect  Neuro: No focal neurologic deficits, A&Ox3    Assessment   [name] is an 68 y.o. male who presents to ED with chest pain and nausea found to have gallbladder wall thickening, and choledocholithiasis on imaging.  Plan  - Recommend GI consult, defer to them on whether needs MRCP first or whether their team would consider ERCP without  MRCP. - FEN - NPO except sips/chips while awaiting GI recs - Agree with IV antibiotics - Recommend medicine admission - IVF per primary team - DVT - SCDs,  ok for DVT  ppx from surgical perspective  I discussed with patient that he needs GI eval for choledocholithiasis and possible ERCP. Pending their recommendations/procedures, told patient we would consider laparoscopic cholecystectomy during this admission.   I reviewed ED provider notes, last 24 h vitals and pain scores, last 48 h intake and output, last 24 h labs and trends, and last 24 h imaging results. Discussed  recommendations directly with EDP  This care required moderate level of medical decision making.   Donata Duff, MD [facility]

## 2023-08-14 NOTE — H&P (Signed)
 History and Physical    [name]:[id number] DOB: 12-07-1955 DOA: 08/13/2023  PCP: [facility]   Patient coming from: Home   Chief Complaint:  Chief Complaint  Patient presents with   Abdominal Pain   ED TRIAGE note:Pt BIB by EMS for sudden onset of epigastric pain and vomiting. Restless upon EMS arrival. Pt pale on arrival with tachycardia noted. Tenderness upon light palpation and possible ABD distention. Pt appears more calm after pain medication by EMS.  Initial VS BP 172/86 HR 133 T 101.7  HPI:  [name] is a 68 y.o. male with medical history significant of rectal adenocarcinoma diagnosed 2014 status post excision and chemotherapy, insulin-dependent DM type II, chronic tachycardia, chronic hepatitis C infection, hyperlipidemia, substance abuse, and GERD presented to emergency department complaining of abdominal pain with associated vomiting.  Patient reported that abdominal pain started Wednesday morning and since then he has around 20 episodes of vomiting.  Patient has normal bowel movement.  Denies any fever, chill, diarrhea and constipation denies any known sick contact.   ED Course:  At presentation to ED patient found to be tachycardic heart rate 133, elevated temperature 101.7 degrees Fahrenheit, elevated blood pressure 172/86 which improved to 130/83.  O2 sat 100% room air.  CBC no evidence of leukocytosis.  Stable H&H 9.4 and 26.  Platelet count 164. BMP showing low potassium 3.2 low bicarb 19, elevated blood glucose 201, elevated creatinine 1.3, otherwise unremarkable. Elevated lactic acid 2.6 which has been improved to 1.5. Normal lipase level 29. Blood cultures are in process. Troponin x 2 within normal range. EKG showing sinus tachycardia heart rate 131.  CT chest abdomen pelvis showed cholelithiasis. A few small stones noted within the cystic duct and common bile duct. Mild gallbladder wall thickening. Findings concerning for  possible acute cholecystitis.  Rectal wall thickening with surrounding inflammation concerning for proctitis.  Colonic diverticulosis.  Dependent atelectasis in the lower lobes.  Coronary artery disease, aortic atherosclerosis.   With the concern for cholecystitis, choledocholithiasis in the ED general surgery Dr. Azucena Cecil has been consulted recommended GI involvement and consult.  In the ED patient has been given cefepime 2 g, IV metronidazole, morphine 4 mg, IV vancomycin,, 1 L LR bolus currently on LR at 150 cc/h.  ED provider also consulted [facility] Dr. Doy Hutching for further recommendation based on the MRCP finding  Hospitalist has been consulted for further evaluation management of acute cholecystitis, choledocholithiasis and development of sepsis in the setting of acute cholecystitis.   Significant labs in the ED: Lab Orders         Resp panel by RT-PCR (RSV, Flu A&B, Covid) Anterior Nasal Swab         Blood Culture (routine x 2)         Basic metabolic panel         CBC         Protime-INR         APTT         Urinalysis, w/ Reflex to Culture (Infection Suspected) -Urine, Clean Catch         Lipase, blood         Hepatic function panel         CBC with Differential         CBC         Comprehensive metabolic panel         HCV Ab w Reflex to Quant PCR  HIV Antibody (routine testing w rflx)         I-Stat CG4 Lactic Acid       Review of Systems:  Review of Systems  Constitutional:  Positive for fever and malaise/fatigue. Negative for chills and weight loss.  Respiratory:  Negative for cough and hemoptysis.   Cardiovascular:  Negative for chest pain, palpitations, orthopnea and leg swelling.  Gastrointestinal:  Positive for abdominal pain, nausea and vomiting. Negative for constipation, diarrhea and heartburn.  Musculoskeletal:  Negative for back pain, myalgias and neck pain.  Neurological:  Negative for dizziness.  Endo/Heme/Allergies:  Does not bruise/bleed  easily.  Psychiatric/Behavioral:  The patient is not nervous/anxious.   All other systems reviewed and are negative.   Past Medical History:  Diagnosis Date   Cancer (HCC)    colon   Depression    Diabetes mellitus without complication (HCC)    Hallucinations    Hypercholesteremia    Hypertension    PTSD (post-traumatic stress disorder)    Suicidal ideation     Past Surgical History:  Procedure Laterality Date   colon rectal surgery     2014   COLONOSCOPY     CORONARY ANGIOPLASTY WITH STENT PLACEMENT       reports that he has never smoked. He does not have any smokeless tobacco history on file. He reports current alcohol use of about 4.0 standard drinks of alcohol per week. He reports that he does not currently use drugs after having used the following drugs: Codeine.  Allergies  Allergen Reactions   Spironolactone Other (See Comments)    Added to [facility]     Family History  Problem Relation Age of Onset   Colon cancer Neg Hx    Colon polyps Neg Hx    Esophageal cancer Neg Hx    Rectal cancer Neg Hx    Stomach cancer Neg Hx     Prior to Admission medications   Medication Sig Start Date End Date Taking? Authorizing Provider  amLODipine (NORVASC) 10 MG tablet Take 10 mg by mouth daily.   Yes [provider]  aspirin EC 81 MG tablet Take 81 mg by mouth daily.   Yes [provider]  atorvastatin (LIPITOR) 20 MG tablet Take 20 mg by mouth daily.   Yes [provider]  Cholestyramine POWD Take 1 scoop by mouth daily. MIXED IN 6 OUNCES OF WATER OR JUICE   Yes [provider]  Cyanocobalamin (VITAMIN B12 PO) Take 2 tablets by mouth in the morning and at bedtime.   Yes [provider]  dibucaine (NUPERCAINAL) 1 % ointment Apply 1 application topically daily as needed (for anal pain).    Yes [provider]  escitalopram (LEXAPRO) 20 MG tablet Take 20 mg by mouth daily.   Yes [provider]  gabapentin  (NEURONTIN) 300 MG capsule Take 300 mg by mouth at bedtime as needed (for neuropathy).   Yes [provider]  hydrocortisone cream 1 % Apply 1 application topically 2 (two) times daily as needed for itching.   Yes [provider]  hydrOXYzine (VISTARIL) 25 MG capsule Take 25 mg by mouth every 6 (six) hours as needed for anxiety. 04/01/23  Yes [provider]  insulin aspart (NOVOLOG) 100 UNIT/ML FlexPen Inject 4-7 Units into the skin 3 (three) times daily before meals. Per sliding scale 01/13/23  Yes [provider]  insulin glargine-yfgn (SEMGLEE) 100 UNIT/ML injection Inject 32 Units into the skin at bedtime. 04/22/22  Yes [provider]  LACTOBACILLUS PO Take 2 tablets by mouth 2 (two) times daily.   Yes [provider]  loperamide (IMODIUM) 2 MG capsule Take 2 mg by mouth 2 (two) times daily as needed (for chronic diarrhea). 08/11/23  Yes [provider]  losartan (COZAAR) 100 MG tablet Take 100 mg by mouth daily.   Yes [provider]  meloxicam (MOBIC) 15 MG tablet Take 15 mg by mouth daily as needed (for foot pain).   Yes [provider]  methocarbamol (ROBAXIN) 750 MG tablet Take 750 mg by mouth daily as needed for muscle spasms. 01/13/23  Yes [provider]  metoprolol tartrate (LOPRESSOR) 50 MG tablet Take 25 mg by mouth 2 (two) times daily. 01/14/23  Yes [provider]  mirtazapine (REMERON) 15 MG tablet Take 15 mg by mouth at bedtime.   Yes [provider]  prazosin (MINIPRESS) 1 MG capsule Take 1 mg by mouth at bedtime as needed (for nightmares). 04/01/23  Yes [provider]  sildenafil (VIAGRA) 25 MG tablet Take 12.5 mg by mouth as needed for erectile dysfunction. 08/11/23  Yes [provider]  simethicone (MYLICON) 80 MG chewable tablet Chew 80 mg by mouth 2 (two) times daily as needed for flatulence.   Yes [provider]  traZODone (DESYREL) 100 MG  tablet Take 100-200 mg by mouth at bedtime as needed for sleep (mood). 04/01/23  Yes [provider]     Physical Exam: Vitals:   08/13/23 2328 08/14/23 0000 08/14/23 0030 08/14/23 0200  BP:  131/78 133/77 138/83  Pulse:  (!) 128 (!) 131 (!) 120  Resp:  (!) 22 20 16   Temp: (!) 100.4 F (38 C)     TempSrc: Oral     SpO2:  95% 93% 98%  Weight:      Height:        Physical Exam Vitals and nursing note reviewed.  Constitutional:      Appearance: He is ill-appearing.  Cardiovascular:     Rate and Rhythm: Regular rhythm. Tachycardia present.  Pulmonary:     Effort: Pulmonary effort is normal.  Abdominal:     General: Bowel sounds are normal. There is no distension.     Palpations: Abdomen is soft.     Tenderness: There is abdominal tenderness in the right upper quadrant. There is no guarding or rebound. Negative signs include Murphy's sign.  Skin:    General: Skin is dry.  Neurological:     General: No focal deficit present.     Mental Status: He is alert.  Psychiatric:        Mood and Affect: Mood normal. Mood is not anxious.      Labs on Admission: I have personally reviewed following labs and imaging studies  CBC: Recent Labs  Lab 08/13/23 1856 08/14/23 0222  WBC 1.4* 4.0  NEUTROABS  --  3.9  HGB 10.7* 9.4*  HCT 30.5* 26.9*  MCV 105.9* 105.5*  PLT 189 164   Basic Metabolic Panel: Recent Labs  Lab 08/13/23 1856  NA 135  K 3.2*  CL 102  CO2 19*  GLUCOSE 201*  BUN 18  CREATININE 1.39*  CALCIUM 8.5*   GFR: Estimated Creatinine Clearance: 64.3 mL/min (A) (by C-G formula based on SCr of 1.39 mg/dL (H)). Liver Function Tests: Recent Labs  Lab 08/14/23 0222  AST 117*  ALT 36  ALKPHOS 93  BILITOT 8.8*  PROT 6.3*  ALBUMIN 3.2*   Recent Labs  Lab 08/13/23 2144  LIPASE 29   No results for input(s): "AMMONIA" in the last 168 hours. Coagulation Profile: Recent Labs  Lab 08/13/23 2144  INR 1.2   Cardiac Enzymes: Recent Labs  Lab  08/13/23 1856 08/13/23 2144  TROPONINIHS 10 14   BNP (last 3 results) No results for input(s): "BNP" in the last 8760 hours. HbA1C: No results for input(s): "HGBA1C" in the last 72 hours. CBG: No results for input(s): "GLUCAP" in the last 168 hours. Lipid Profile: No results for input(s): "CHOL", "HDL", "LDLCALC", "TRIG", "CHOLHDL", "LDLDIRECT" in the last 72 hours. Thyroid Function Tests: No results for input(s): "TSH", "T4TOTAL", "FREET4", "T3FREE", "THYROIDAB" in the last 72 hours. Anemia Panel: No results for input(s): "VITAMINB12", "FOLATE", "FERRITIN", "TIBC", "IRON", "RETICCTPCT" in the last 72 hours. Urine analysis: No results found for: "COLORURINE", "APPEARANCEUR", "LABSPEC", "PHURINE", "GLUCOSEU", "HGBUR", "BILIRUBINUR", "KETONESUR", "PROTEINUR", "UROBILINOGEN", "NITRITE", "LEUKOCYTESUR"  Radiological Exams on Admission: I have personally reviewed images CT CHEST ABDOMEN PELVIS W CONTRAST Result Date: 08/14/2023 CLINICAL DATA:  Sepsis, shortness of breath, abdominal pain, vomiting EXAM: CT CHEST, ABDOMEN, AND PELVIS WITH CONTRAST TECHNIQUE: Multidetector CT imaging of the chest, abdomen and pelvis was performed following the standard protocol during bolus administration of intravenous contrast. RADIATION DOSE REDUCTION: This exam was performed according to the departmental dose-optimization program which includes automated exposure control, adjustment of the mA and/or kV according to patient size and/or use of iterative reconstruction technique. CONTRAST:  75mL OMNIPAQUE IOHEXOL 350 MG/ML SOLN COMPARISON:  1120 set FINDINGS: CT CHEST FINDINGS Cardiovascular: Heart is normal size. Aorta is normal caliber. No filling defects in the pulmonary arteries to suggest pulmonary emboli. Scattered coronary artery and aortic calcifications. Mediastinum/Nodes: No mediastinal, hilar, or axillary adenopathy. Trachea and esophagus are unremarkable. Thyroid unremarkable. Lungs/Pleura: Dependent  atelectasis in the lower lobes. No effusions. Musculoskeletal: Chest wall soft tissues are unremarkable. No acute bony abnormality. CT ABDOMEN PELVIS FINDINGS Hepatobiliary: Multiple gallstones within the gallbladder. A few small stones noted in the distal common bile duct. Gallbladder wall appears thickened. No biliary ductal dilatation. Pancreas: No focal abnormality or ductal dilatation. Spleen: No focal abnormality.  Normal size. Adrenals/Urinary Tract: Bilateral renal benign cysts. No follow-up imaging recommended. No stones or hydronephrosis. Urinary bladder and adrenal glands unremarkable. Stomach/Bowel: There is mild wall thickening within the rectum with surrounding inflammatory stranding suggesting proctitis. Colonic diverticulosis. No active diverticulitis. Appendix normal. Stomach and small bowel decompressed. No bowel obstruction. Vascular/Lymphatic: Aortic atherosclerosis. No evidence of aneurysm or adenopathy. Reproductive: No visible focal abnormality. Other: No free fluid or free air. Musculoskeletal: No acute bony abnormality. IMPRESSION: Cholelithiasis. A few small stones noted within the cystic duct and common bile duct. Mild gallbladder wall thickening. Findings concerning for possible acute cholecystitis. Rectal wall thickening with surrounding inflammation concerning for proctitis. Colonic diverticulosis. Dependent atelectasis in the lower lobes. Coronary artery disease, aortic atherosclerosis. Electronically Signed   By: Charlett Nose M.D.   On: 08/14/2023 01:21   DG Chest 2 View Result Date: 08/13/2023 CLINICAL DATA:  Short of breath, epigastric pain, vomiting EXAM: CHEST - 2 VIEW COMPARISON:  06/28/2005 FINDINGS: The heart size and mediastinal contours are within normal limits. Both lungs are clear. The visualized skeletal structures are unremarkable. IMPRESSION: No active cardiopulmonary disease. Electronically Signed   By: Sharlet Salina M.D.   On: 08/13/2023 19:37     EKG: My  personal interpretation of EKG shows: Sinus tachycardia heart rate 131.   Assessment/Plan: Principal Problem:   Choledocholithiasis with acute cholecystitis Active Problems:  Sepsis (HCC)   Acute cholecystitis   Chronic hepatitis C virus infection (HCC)   Hyperlipidemia   GERD (gastroesophageal reflux disease)   Insulin dependent type 2 diabetes mellitus (HCC)   Chronic tachycardia   GAD (generalized anxiety disorder)   Insomnia   Neuropathic pain   Hypokalemia   AKI (acute kidney injury) (HCC)   Metabolic acidosis    Assessment and Plan: Choledocholithiasis with acute cholecystitis Sepsis secondary to acute cholecystitis -Patient present emergency department complaining of abdominal pain and 20 episodes of nonbloody and not biliary vomiting since Wednesday, 08/13/2023.  Patient denies any fever, chill and diarrhea. -At presentation to ED patient is tachycardic and hypertensive.  Febrile.  CBC no leukocytosis.  Initial lactic acid 2.6 improved to 1.5.  Elevated bilirubin 8.8.  Elevated AST.  Normal ALP and ALT level. - Abdomen pelvis showed acute cholecystitis, small stone in cystic duct and common bile duct. - In the presence of acute choledocholithiasis and cholecystitis General Surgery Dr.Burton has been consulted recommended MRCP versus ERCP based on GI recommendation, continue IV antibiotic and IV fluid.  General surgery, need to consult GI. - [facility] Dr. Doy Hutching has been consulted. - Pending MRCP result. - In the ED patient has been treated with IV vancomycin, cefepime and metronidazole. -Blood cultures are in process. - De-escalating antibiotic to ceftriaxone and metronidazole.  Will follow-up with blood cultures result. - Continue fluid LR 150 cc/h. - Keeping patient NPO.  Prerenal acute kidney injury - Creatinine 1.39.  Baseline normal creatinine and renal function - Prerenal acute kidney injury in the setting of dehydration from persistent vomiting. - Continue  maintenance fluid LR 150 cc/h - Continue to monitor urine output, checking UA, urine creatinine and urine sodium. -Renally adjust medications and avoid nephrotoxic agent.   Non-anion gap metabolic acidosis - Low bicarb 19 and normal anion gap 14. -NAGMA in the setting of AKI. - Continue maintenance IV fluid   History of chronic sinus tachycardia -Heart rate in between 120-135 range.  Reflect sinus tachycardia in the setting of sepsis.  Patient also has history of chronic sinus tachycardia.  Resuming metoprolol 25 mg twice daily.  Hypokalemia - Low potassium 3.2.  Replating with IV KCl.  Hyperlipidemia -Holding Lipitor as patient is n.p.o.  GERD -Holding Protonix.  Insulin-dependent DM type II -Holding long and short acting insulin as patient is NPO.  Generalized anxiety disorder -Continue Atarax 25 mg as needed and Lexapro 20 mg daily  Insomnia -Continue Remeron  Peripheral neuropathy/neuropathic pain -Continue gabapentin and Robaxin.  History of chronic hepatitis C infection -Per chart review previous ID clinic visit on 06/2014.  Unclear-clear etiology patient has completed treatment in the past or not. - Checking hepatitis C antibody with reflex quantitive PCR.   DVT prophylaxis:  SQ Heparin Code Status:  Full Code Diet: Currently NPO.  CP with meds and ice chips are allowed Family Communication:   Family was present at bedside, at the time of interview. Opportunity was given to ask question and all questions were answered satisfactorily.  Disposition Plan: Pending MRCP result and formal GI evaluation. Consults: General Surgery and gastroenterology Admission status:   Inpatient, Telemetry bed  Severity of Illness: The appropriate patient status for this patient is INPATIENT. Inpatient status is judged to be reasonable and necessary in order to provide the required intensity of service to ensure the patient's safety. The patient's presenting symptoms, physical exam  findings, and initial radiographic and laboratory data in the context of their chronic comorbidities is  felt to place them at high risk for further clinical deterioration. Furthermore, it is not anticipated that the patient will be medically stable for discharge from the hospital within 2 midnights of admission.   * I certify that at the point of admission it is my clinical judgment that the patient will require inpatient hospital care spanning beyond 2 midnights from the point of admission due to high intensity of service, high risk for further deterioration and high frequency of surveillance required.Marland Kitchen    Tereasa Coop, MD [facility]  How to contact the [facility] Attending or Consulting provider 7A - 7P or covering provider during after hours 7P -7A, for this patient.  Check the care team in [facility] and look for a) attending/consulting [facility] provider listed and b) the [facility] team listed Log into [email or web address] and use [place]'s universal password to access. If you do not have the password, please contact the hospital operator. Locate the [facility] provider you are looking for under [facility] and page to a number that you can be directly reached. If you still have difficulty reaching the provider, please page the [facility] (Director on Call) for the Hospitalists listed on amion for assistance.  08/14/2023, 5:37 AM

## 2023-08-14 NOTE — Consult Note (Addendum)
 [facility] Consult Note   History [name] MRN # [id number]  Date of Admission: 08/13/2023 Date of Consultation: 08/14/2023 Referring physician: Dr. Dorcas Carrow, MD Primary Care Provider: [facility] Primary Gastroenterologist: Dr. Tiajuana Amass [facility])   Reason for Consultation/Chief Complaint: Choledocholithiasis  Subjective  HPI:  68 year old man came to the ED last evening with escalating right upper quadrant pain for several days, found to be febrile and tachycardic with jaundice and icterus. Elevated LFTs, CTAP showing gallstones and CBD stones, mild gallbladder wall thickening no reported biliary ductal dilatation.  MRCP was ordered with report below, confirming stones in the gallbladder, cystic duct and common bile duct Patient says he is still having right upper quadrant pain.  No chest pain or dyspnea.  He is frustrated by his length of stay in the ED and that he is currently in a hallway stretcher rather than the room. No previously known gallbladder disease. Seen overnight in surgical consultation Blood cultures positive for Klebsiella this morning ROS:  Constitutional: No recent weight loss   All other systems are negative except as noted above in the HPI  Past Medical History Past Medical History:  Diagnosis Date   Cancer (HCC)    colon   Depression    Diabetes mellitus without complication (HCC)    Hallucinations    Hypercholesteremia    Hypertension    PTSD (post-traumatic stress disorder)    Suicidal ideation     Past Surgical History Past Surgical History:  Procedure Laterality Date   colon rectal surgery     2014   COLONOSCOPY     CORONARY ANGIOPLASTY WITH STENT PLACEMENT      Family History Family History  Problem Relation Age of Onset   Colon cancer Neg Hx    Colon polyps Neg Hx    Esophageal cancer Neg Hx    Rectal cancer Neg Hx    Stomach cancer Neg Hx     Social History Social  History   Socioeconomic History   Marital status: Married    Spouse name: Not on file   Number of children: Not on file   Years of education: Not on file   Highest education level: Not on file  Occupational History   Not on file  Tobacco Use   Smoking status: Never   Smokeless tobacco: Not on file  Vaping Use   Vaping status: Never Used  Substance and Sexual Activity   Alcohol use: Yes    Alcohol/week: 4.0 standard drinks of alcohol    Types: 4 Cans of beer per week   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  Inpatient med list reviewed  _____________________________________________________________________ Objective   Exam:  Current vital signs  Patient Vitals for the past 8 hrs:  BP Temp Temp src Pulse Resp SpO2  08/14/23 1100  125/79 -- -- 92 17 98 %  08/14/23 0930 -- 99 F (37.2 C) Oral -- -- --  08/14/23 0915 133/81 -- -- (!) 109 (!) 23 100 %  08/14/23 0840 (!) 155/117 -- -- (!) 107 19 98 %  08/14/23 0810 -- -- -- (!) 106 18 100 %  08/14/23 0800 (!) 154/102 -- -- (!) 108 (!) 26 100 %  08/14/23 0535 (!) 150/92 99 F (37.2 C) Oral (!) 107 (!) 23 100 %   No intake or output data in the 24 hours ending 08/14/23 1329  Physical Exam:  At the time of my evaluation, blood pressure 120s over 70s, pulse 92 General: this is a male patient in no acute distress.  He is laying comfortably in a Psychologist, counselling.  Nontoxic-appearing Eyes: sclera icteric, no redness ENT: oral mucosa moist without lesions, no cervical or supraclavicular lymphadenopathy, poor dentition -adequate oral opening CV: RRR without murmur, S1/S2, no JVD,, no peripheral edema Resp: clear to auscultation bilaterally, normal RR and effort noted GI: soft, + right upper quadrant tenderness with guarding Skin; warm and dry, + jaundice Neuro: awake, alert and oriented x 3. Normal gross motor function and fluent speech.  Labs:     Latest Ref Rng & Units 08/14/2023    5:50 AM 08/14/2023    2:22 AM 08/13/2023    6:56 PM  CBC  WBC 4.0 - 10.5 K/uL 5.6  4.0  1.4   Hemoglobin 13.0 - 17.0 g/dL 9.9  9.4  16.1   Hematocrit 39.0 - 52.0 % 28.1  26.9  30.5   Platelets 150 - 400 K/uL 167  164  189        Latest Ref Rng & Units 08/14/2023    5:50 AM 08/14/2023    2:22 AM 08/13/2023    6:56 PM  CMP  Glucose 70 - 99 mg/dL 096   045   BUN 8 - 23 mg/dL 21   18   Creatinine 4.09 - 1.24 mg/dL 8.11   9.14   Sodium 782 - 145 mmol/L 133   135   Potassium 3.5 - 5.1 mmol/L 4.0   3.2   Chloride 98 - 111 mmol/L 100   102   CO2 22 - 32 mmol/L 18   19   Calcium 8.9 - 10.3 mg/dL 8.2   8.5   Total Protein 6.5 - 8.1 g/dL 6.5  6.3    Total Bilirubin 0.0 - 1.2 mg/dL 8.6  8.8    Alkaline Phos 38 - 126 U/L 92  93    AST 15 - 41 U/L 107  117    ALT 0 - 44 U/L 38  36      Recent Labs  Lab 08/13/23 2144  INR 1.2   _________________________________________________________ Radiologic studies:  Narrative & Impression  CLINICAL DATA:  Sepsis, shortness of breath, abdominal pain, vomiting   EXAM: CT CHEST, ABDOMEN, AND PELVIS WITH CONTRAST   TECHNIQUE: Multidetector CT imaging of the chest, abdomen and pelvis was performed following the standard protocol during bolus administration of intravenous contrast.   RADIATION DOSE REDUCTION: This exam was performed according to the departmental dose-optimization program which includes  automated exposure control, adjustment of the mA and/or kV according to patient size and/or use of iterative reconstruction technique.   CONTRAST:  75mL OMNIPAQUE IOHEXOL 350 MG/ML SOLN   COMPARISON:  1120 set   FINDINGS: CT CHEST FINDINGS   Cardiovascular: Heart is normal size. Aorta is normal caliber. No filling  defects in the pulmonary arteries to suggest pulmonary emboli. Scattered coronary artery and aortic calcifications.   Mediastinum/Nodes: No mediastinal, hilar, or axillary adenopathy. Trachea and esophagus are unremarkable. Thyroid unremarkable.   Lungs/Pleura: Dependent atelectasis in the lower lobes. No effusions.   Musculoskeletal: Chest wall soft tissues are unremarkable. No acute bony abnormality.   CT ABDOMEN PELVIS FINDINGS   Hepatobiliary: Multiple gallstones within the gallbladder. A few small stones noted in the distal common bile duct. Gallbladder wall appears thickened. No biliary ductal dilatation.   Pancreas: No focal abnormality or ductal dilatation.   Spleen: No focal abnormality.  Normal size.   Adrenals/Urinary Tract: Bilateral renal benign cysts. No follow-up imaging recommended. No stones or hydronephrosis. Urinary bladder and adrenal glands unremarkable.   Stomach/Bowel: There is mild wall thickening within the rectum with surrounding inflammatory stranding suggesting proctitis. Colonic diverticulosis. No active diverticulitis. Appendix normal. Stomach and small bowel decompressed. No bowel obstruction.   Vascular/Lymphatic: Aortic atherosclerosis. No evidence of aneurysm or adenopathy.   Reproductive: No visible focal abnormality.   Other: No free fluid or free air.   Musculoskeletal: No acute bony abnormality.   IMPRESSION: Cholelithiasis. A few small stones noted within the cystic duct and common bile duct. Mild gallbladder wall thickening. Findings concerning for possible acute cholecystitis.   Rectal wall thickening with  surrounding inflammation concerning for proctitis.   Colonic diverticulosis.   Dependent atelectasis in the lower lobes.   Coronary artery disease, aortic atherosclerosis.     Electronically Signed   By: Charlett Nose M.D.   On: 08/14/2023 01:21   CLINICAL DATA:  Cholelithiasis Elevated bilirubin; Cholelithiasis 175970 Biliary obstruction 175970 Elevated bilirubin   EXAM: MRI ABDOMEN WITHOUT AND WITH CONTRAST (INCLUDING MRCP)   TECHNIQUE: Multiplanar multisequence MR imaging of the abdomen was performed both before and after the administration of intravenous contrast. Heavily T2-weighted images of the biliary and pancreatic ducts were obtained, and three-dimensional MRCP images were rendered by post processing.   CONTRAST:  10mL GADAVIST GADOBUTROL 1 MMOL/ML IV SOLN   COMPARISON:  CT scan abdomen and pelvis from earlier the same day.   FINDINGS: Lower chest: Unremarkable MR appearance to the lung bases. No pleural effusion. No pericardial effusion. Normal heart size.   Hepatobiliary: The liver is normal in size and configuration. No intra or extrahepatic bile duct dilation. However, there are at least 3, sub 5 mm calculi in the cystic duct (series 14, images 23-25) and at least 2, sub 5 mm calculi in the distal common bile duct. The gallbladder is physiologically distended. There is focal fundal adenomyomatosis. There are small volume multiple sub 5 mm dependent gallstones. No abnormal gallbladder wall thickening or pericholecystic inflammatory changes.   Pancreas: No mass, inflammatory changes or other parenchymal abnormality identified. No main pancreatic duct dilation.   Spleen:  Within normal limits in size and appearance. No focal mass.   Adrenals/Urinary Tract: Unremarkable adrenal glands. There are multiple bilateral simple renal cysts with largest arising from the left kidney upper pole, posteriorly measuring up to 3.5 x 5.1 cm. No hydroureteronephrosis on  either side.   Stomach/Bowel: Visualized portions within the abdomen are unremarkable. No disproportionate dilation of bowel loops.   Vascular/Lymphatic: No pathologically enlarged lymph nodes identified. No abdominal aortic aneurysm demonstrated. No ascites.   Other:  None.   Musculoskeletal: No suspicious bone lesions identified.   IMPRESSION: 1. There are several, sub 5 mm calculi in the cystic duct and distal common bile duct. No intra or extrahepatic bile  duct dilation. Multiple small volume gallstones without imaging signs of acute cholecystitis. 2. Multiple other nonacute observations, as described above.     Electronically Signed   By: Jules Schick M.D.   On: 08/14/2023 08:33  ______________________________________________________ Other studies:   _______________________________________________________ Assessment & Plan  Impression:  Right upper quadrant pain Clinical picture of SIRS upon presentation, now improved with normal vital signs after antibiotics and volume resuscitation.. Klebsiella bacteremia Gallbladder stones and choledocholithiasis Suspected cholecystitis and cholangitis.  Suspect he became bacteremic in the last 24 hours or so accounting for his acute worsening of symptoms and overall clinical picture.  He has already been seen overnight in surgical consultation and they are following with eventual plans for cholecystectomy. Plan:  He needs an ERCP with our biliary endoscopist Dr. Marina Goodell, who has also reviewed the case.  This will be done tomorrow with current availability of biliary endoscopist, staff and anesthesia.  He appears clinically stable from the standpoint of his infection.  Should he have decompensation of his overall clinical picture with signs of worsening sepsis, then contact the GI service because this patient may need to go for a PTC.  An ERCP was described in detail to this patient along with risks and benefits and he was  agreeable.  The benefits and risks of the planned procedure(s) were described in detail with the patient or (when appropriate) their health care proxy.  Risks were outlined as including, but not limited to, bleeding, infection, perforation, adverse medication reaction leading to cardiac or pulmonary decompensation, pancreatitis (if ERCP).  The limitation of incomplete mucosal visualization was also discussed.  No guarantees or warranties were given.  Clear liquid diet today, n.p.o. after midnight  I have also messaged Dr. Jerral Ralph of the hospitalist service,recommening that if not already done so, this patient should get more aggressive IV fluids because he has had a rise in his creatinine overnight.  He has systemic infection and received IV contrast for his CT scan.  Thank you for the courtesy of this consult.  Please contact me with any questions or concerns.  Charlie Pitter III Office: [phone]

## 2023-08-14 NOTE — Progress Notes (Signed)
 PHARMACY - PHYSICIAN COMMUNICATION CRITICAL VALUE ALERT - BLOOD CULTURE IDENTIFICATION (BCID)  [name] is an 68 y.o. male who presented to [facility] on 08/13/2023 with a chief complaint of epigastric pain and vomiting  Assessment:  BCID for GNR in 3 of 4 klebsiella pneumo without resistance  Name of physician (or Provider) Contacted: Dr. 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  Rutherford Nail, PharmD PGY2 Critical Care Pharmacy Resident 08/14/2023  12:00 PM

## 2023-08-14 NOTE — H&P (View-Only) (Signed)
 [facility] Consult Note   History NASIM HABEEB MRN # [id number]  Date of Admission: 08/13/2023 Date of Consultation: 08/14/2023 Referring physician: Dr. Dorcas Carrow, MD Primary Care Provider: [facility] Primary Gastroenterologist: Dr. Tiajuana Amass [facility])   Reason for Consultation/Chief Complaint: Choledocholithiasis  Subjective  HPI:  68 year old man came to the ED last evening with escalating right upper quadrant pain for several days, found to be febrile and tachycardic with jaundice and icterus. Elevated LFTs, CTAP showing gallstones and CBD stones, mild gallbladder wall thickening no reported biliary ductal dilatation.  MRCP was ordered with report below, confirming stones in the gallbladder, cystic duct and common bile duct Patient says he is still having right upper quadrant pain.  No chest pain or dyspnea.  He is frustrated by his length of stay in the ED and that he is currently in a hallway stretcher rather than the room. No previously known gallbladder disease. Seen overnight in surgical consultation Blood cultures positive for Klebsiella this morning ROS:  Constitutional: No recent weight loss   All other systems are negative except as noted above in the HPI  Past Medical History Past Medical History:  Diagnosis Date   Cancer (HCC)    colon   Depression    Diabetes mellitus without complication (HCC)    Hallucinations    Hypercholesteremia    Hypertension    PTSD (post-traumatic stress disorder)    Suicidal ideation     Past Surgical History Past Surgical History:  Procedure Laterality Date   colon rectal surgery     2014   COLONOSCOPY     CORONARY ANGIOPLASTY WITH STENT PLACEMENT      Family History Family History  Problem Relation Age of Onset   Colon cancer Neg Hx    Colon polyps Neg Hx    Esophageal cancer Neg Hx    Rectal cancer Neg Hx    Stomach cancer Neg Hx     Social History Social  History   Socioeconomic History   Marital status: Married    Spouse name: Not on file   Number of children: Not on file   Years of education: Not on file   Highest education level: Not on file  Occupational History   Not on file  Tobacco Use   Smoking status: Never   Smokeless tobacco: Not on file  Vaping Use   Vaping status: Never Used  Substance and Sexual Activity   Alcohol use: Yes    Alcohol/week: 4.0 standard drinks of alcohol    Types: 4 Cans of beer per week   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  Inpatient med list reviewed  _____________________________________________________________________ Objective   Exam:  Current vital signs  Patient Vitals for the past 8 hrs:  BP Temp Temp src Pulse Resp SpO2  08/14/23 1100  125/79 -- -- 92 17 98 %  08/14/23 0930 -- 99 F (37.2 C) Oral -- -- --  08/14/23 0915 133/81 -- -- (!) 109 (!) 23 100 %  08/14/23 0840 (!) 155/117 -- -- (!) 107 19 98 %  08/14/23 0810 -- -- -- (!) 106 18 100 %  08/14/23 0800 (!) 154/102 -- -- (!) 108 (!) 26 100 %  08/14/23 0535 (!) 150/92 99 F (37.2 C) Oral (!) 107 (!) 23 100 %   No intake or output data in the 24 hours ending 08/14/23 1329  Physical Exam:  At the time of my evaluation, blood pressure 120s over 70s, pulse 92 General: this is a male patient in no acute distress.  He is laying comfortably in a Psychologist, counselling.  Nontoxic-appearing Eyes: sclera icteric, no redness ENT: oral mucosa moist without lesions, no cervical or supraclavicular lymphadenopathy, poor dentition -adequate oral opening CV: RRR without murmur, S1/S2, no JVD,, no peripheral edema Resp: clear to auscultation bilaterally, normal RR and effort noted GI: soft, + right upper quadrant tenderness with guarding Skin; warm and dry, + jaundice Neuro: awake, alert and oriented x 3. Normal gross motor function and fluent speech.  Labs:     Latest Ref Rng & Units 08/14/2023    5:50 AM 08/14/2023    2:22 AM 08/13/2023    6:56 PM  CBC  WBC 4.0 - 10.5 K/uL 5.6  4.0  1.4   Hemoglobin 13.0 - 17.0 g/dL 9.9  9.4  16.1   Hematocrit 39.0 - 52.0 % 28.1  26.9  30.5   Platelets 150 - 400 K/uL 167  164  189        Latest Ref Rng & Units 08/14/2023    5:50 AM 08/14/2023    2:22 AM 08/13/2023    6:56 PM  CMP  Glucose 70 - 99 mg/dL 096   045   BUN 8 - 23 mg/dL 21   18   Creatinine 4.09 - 1.24 mg/dL 8.11   9.14   Sodium 782 - 145 mmol/L 133   135   Potassium 3.5 - 5.1 mmol/L 4.0   3.2   Chloride 98 - 111 mmol/L 100   102   CO2 22 - 32 mmol/L 18   19   Calcium 8.9 - 10.3 mg/dL 8.2   8.5   Total Protein 6.5 - 8.1 g/dL 6.5  6.3    Total Bilirubin 0.0 - 1.2 mg/dL 8.6  8.8    Alkaline Phos 38 - 126 U/L 92  93    AST 15 - 41 U/L 107  117    ALT 0 - 44 U/L 38  36      Recent Labs  Lab 08/13/23 2144  INR 1.2   _________________________________________________________ Radiologic studies:  Narrative & Impression  CLINICAL DATA:  Sepsis, shortness of breath, abdominal pain, vomiting   EXAM: CT CHEST, ABDOMEN, AND PELVIS WITH CONTRAST   TECHNIQUE: Multidetector CT imaging of the chest, abdomen and pelvis was performed following the standard protocol during bolus administration of intravenous contrast.   RADIATION DOSE REDUCTION: This exam was performed according to the departmental dose-optimization program which includes  automated exposure control, adjustment of the mA and/or kV according to patient size and/or use of iterative reconstruction technique.   CONTRAST:  75mL OMNIPAQUE IOHEXOL 350 MG/ML SOLN   COMPARISON:  1120 set   FINDINGS: CT CHEST FINDINGS   Cardiovascular: Heart is normal size. Aorta is normal caliber. No filling  defects in the pulmonary arteries to suggest pulmonary emboli. Scattered coronary artery and aortic calcifications.   Mediastinum/Nodes: No mediastinal, hilar, or axillary adenopathy. Trachea and esophagus are unremarkable. Thyroid unremarkable.   Lungs/Pleura: Dependent atelectasis in the lower lobes. No effusions.   Musculoskeletal: Chest wall soft tissues are unremarkable. No acute bony abnormality.   CT ABDOMEN PELVIS FINDINGS   Hepatobiliary: Multiple gallstones within the gallbladder. A few small stones noted in the distal common bile duct. Gallbladder wall appears thickened. No biliary ductal dilatation.   Pancreas: No focal abnormality or ductal dilatation.   Spleen: No focal abnormality.  Normal size.   Adrenals/Urinary Tract: Bilateral renal benign cysts. No follow-up imaging recommended. No stones or hydronephrosis. Urinary bladder and adrenal glands unremarkable.   Stomach/Bowel: There is mild wall thickening within the rectum with surrounding inflammatory stranding suggesting proctitis. Colonic diverticulosis. No active diverticulitis. Appendix normal. Stomach and small bowel decompressed. No bowel obstruction.   Vascular/Lymphatic: Aortic atherosclerosis. No evidence of aneurysm or adenopathy.   Reproductive: No visible focal abnormality.   Other: No free fluid or free air.   Musculoskeletal: No acute bony abnormality.   IMPRESSION: Cholelithiasis. A few small stones noted within the cystic duct and common bile duct. Mild gallbladder wall thickening. Findings concerning for possible acute cholecystitis.   Rectal wall thickening with  surrounding inflammation concerning for proctitis.   Colonic diverticulosis.   Dependent atelectasis in the lower lobes.   Coronary artery disease, aortic atherosclerosis.     Electronically Signed   By: Charlett Nose M.D.   On: 08/14/2023 01:21   CLINICAL DATA:  Cholelithiasis Elevated bilirubin; Cholelithiasis 175970 Biliary obstruction 175970 Elevated bilirubin   EXAM: MRI ABDOMEN WITHOUT AND WITH CONTRAST (INCLUDING MRCP)   TECHNIQUE: Multiplanar multisequence MR imaging of the abdomen was performed both before and after the administration of intravenous contrast. Heavily T2-weighted images of the biliary and pancreatic ducts were obtained, and three-dimensional MRCP images were rendered by post processing.   CONTRAST:  10mL GADAVIST GADOBUTROL 1 MMOL/ML IV SOLN   COMPARISON:  CT scan abdomen and pelvis from earlier the same day.   FINDINGS: Lower chest: Unremarkable MR appearance to the lung bases. No pleural effusion. No pericardial effusion. Normal heart size.   Hepatobiliary: The liver is normal in size and configuration. No intra or extrahepatic bile duct dilation. However, there are at least 3, sub 5 mm calculi in the cystic duct (series 14, images 23-25) and at least 2, sub 5 mm calculi in the distal common bile duct. The gallbladder is physiologically distended. There is focal fundal adenomyomatosis. There are small volume multiple sub 5 mm dependent gallstones. No abnormal gallbladder wall thickening or pericholecystic inflammatory changes.   Pancreas: No mass, inflammatory changes or other parenchymal abnormality identified. No main pancreatic duct dilation.   Spleen:  Within normal limits in size and appearance. No focal mass.   Adrenals/Urinary Tract: Unremarkable adrenal glands. There are multiple bilateral simple renal cysts with largest arising from the left kidney upper pole, posteriorly measuring up to 3.5 x 5.1 cm. No hydroureteronephrosis on  either side.   Stomach/Bowel: Visualized portions within the abdomen are unremarkable. No disproportionate dilation of bowel loops.   Vascular/Lymphatic: No pathologically enlarged lymph nodes identified. No abdominal aortic aneurysm demonstrated. No ascites.   Other:  None.   Musculoskeletal: No suspicious bone lesions identified.   IMPRESSION: 1. There are several, sub 5 mm calculi in the cystic duct and distal common bile duct. No intra or extrahepatic bile  duct dilation. Multiple small volume gallstones without imaging signs of acute cholecystitis. 2. Multiple other nonacute observations, as described above.     Electronically Signed   By: Jules Schick M.D.   On: 08/14/2023 08:33  ______________________________________________________ Other studies:   _______________________________________________________ Assessment & Plan  Impression:  Right upper quadrant pain Clinical picture of SIRS upon presentation, now improved with normal vital signs after antibiotics and volume resuscitation.. Klebsiella bacteremia Gallbladder stones and choledocholithiasis Suspected cholecystitis and cholangitis.  Suspect he became bacteremic in the last 24 hours or so accounting for his acute worsening of symptoms and overall clinical picture.  He has already been seen overnight in surgical consultation and they are following with eventual plans for cholecystectomy. Plan:  He needs an ERCP with our biliary endoscopist Dr. Marina Goodell, who has also reviewed the case.  This will be done tomorrow with current availability of biliary endoscopist, staff and anesthesia.  He appears clinically stable from the standpoint of his infection.  Should he have decompensation of his overall clinical picture with signs of worsening sepsis, then contact the GI service because this patient may need to go for a PTC.  An ERCP was described in detail to this patient along with risks and benefits and he was  agreeable.  The benefits and risks of the planned procedure(s) were described in detail with the patient or (when appropriate) their health care proxy.  Risks were outlined as including, but not limited to, bleeding, infection, perforation, adverse medication reaction leading to cardiac or pulmonary decompensation, pancreatitis (if ERCP).  The limitation of incomplete mucosal visualization was also discussed.  No guarantees or warranties were given.  Clear liquid diet today, n.p.o. after midnight  I have also messaged Dr. Jerral Ralph of the hospitalist service,recommening that if not already done so, this patient should get more aggressive IV fluids because he has had a rise in his creatinine overnight.  He has systemic infection and received IV contrast for his CT scan.  Thank you for the courtesy of this consult.  Please contact me with any questions or concerns.  Charlie Pitter III Office: [phone]

## 2023-08-15 ENCOUNTER — Inpatient Hospital Stay (HOSPITAL_COMMUNITY): Payer: No Typology Code available for payment source

## 2023-08-15 ENCOUNTER — Encounter (HOSPITAL_COMMUNITY): Payer: Self-pay | Admitting: Internal Medicine

## 2023-08-15 ENCOUNTER — Encounter (HOSPITAL_COMMUNITY)
Admission: EM | Discharge status: Against Medical Advice | Payer: Self-pay | Source: Home / Self Care | Attending: Internal Medicine

## 2023-08-15 DIAGNOSIS — K8042 Calculus of bile duct with acute cholecystitis without obstruction: Secondary | ICD-10-CM | POA: Diagnosis not present

## 2023-08-15 DIAGNOSIS — K8050 Calculus of bile duct without cholangitis or cholecystitis without obstruction: Secondary | ICD-10-CM

## 2023-08-15 DIAGNOSIS — I2510 Atherosclerotic heart disease of native coronary artery without angina pectoris: Secondary | ICD-10-CM

## 2023-08-15 DIAGNOSIS — K8020 Calculus of gallbladder without cholecystitis without obstruction: Secondary | ICD-10-CM | POA: Diagnosis not present

## 2023-08-15 DIAGNOSIS — F418 Other specified anxiety disorders: Secondary | ICD-10-CM

## 2023-08-15 DIAGNOSIS — R7989 Other specified abnormal findings of blood chemistry: Secondary | ICD-10-CM

## 2023-08-15 DIAGNOSIS — I10 Essential (primary) hypertension: Secondary | ICD-10-CM

## 2023-08-15 HISTORY — PX: ERCP: SHX5425

## 2023-08-15 HISTORY — PX: REMOVAL OF STONES: SHX5545

## 2023-08-15 HISTORY — PX: SPHINCTEROTOMY: SHX5544

## 2023-08-15 LAB — COMPREHENSIVE METABOLIC PANEL
ALT: 24 U/L (ref 0–44)
AST: 41 U/L (ref 15–41)
Albumin: 2.6 g/dL — ABNORMAL LOW (ref 3.5–5.0)
Alkaline Phosphatase: 67 U/L (ref 38–126)
Anion gap: 16 — ABNORMAL HIGH (ref 5–15)
BUN: 22 mg/dL (ref 8–23)
CO2: 17 mmol/L — ABNORMAL LOW (ref 22–32)
Calcium: 8.3 mg/dL — ABNORMAL LOW (ref 8.9–10.3)
Chloride: 105 mmol/L (ref 98–111)
Creatinine, Ser: 1.57 mg/dL — ABNORMAL HIGH (ref 0.61–1.24)
GFR, Estimated: 48 mL/min — ABNORMAL LOW (ref >60)
Glucose, Bld: 113 mg/dL — ABNORMAL HIGH (ref 70–99)
Potassium: 3.3 mmol/L — ABNORMAL LOW (ref 3.5–5.1)
Sodium: 138 mmol/L (ref 135–145)
Total Bilirubin: 5.7 mg/dL — ABNORMAL HIGH (ref 0.0–1.2)
Total Protein: 5.9 g/dL — ABNORMAL LOW (ref 6.5–8.1)

## 2023-08-15 LAB — CBC
HCT: 24.8 % — ABNORMAL LOW (ref 39.0–52.0)
Hemoglobin: 8.9 g/dL — ABNORMAL LOW (ref 13.0–17.0)
MCH: 37.1 pg — ABNORMAL HIGH (ref 26.0–34.0)
MCHC: 35.9 g/dL (ref 30.0–36.0)
MCV: 103.3 fL — ABNORMAL HIGH (ref 80.0–100.0)
Platelets: 127 10*3/uL — ABNORMAL LOW (ref 150–400)
RBC: 2.4 MIL/uL — ABNORMAL LOW (ref 4.22–5.81)
RDW: 17.2 % — ABNORMAL HIGH (ref 11.5–15.5)
WBC: 3.9 10*3/uL — ABNORMAL LOW (ref 4.0–10.5)
nRBC: 0 % (ref 0.0–0.2)

## 2023-08-15 LAB — GLUCOSE, CAPILLARY
Glucose-Capillary: 106 mg/dL — ABNORMAL HIGH (ref 70–99)
Glucose-Capillary: 106 mg/dL — ABNORMAL HIGH (ref 70–99)
Glucose-Capillary: 117 mg/dL — ABNORMAL HIGH (ref 70–99)
Glucose-Capillary: 165 mg/dL — ABNORMAL HIGH (ref 70–99)
Glucose-Capillary: 202 mg/dL — ABNORMAL HIGH (ref 70–99)
Glucose-Capillary: 291 mg/dL — ABNORMAL HIGH (ref 70–99)

## 2023-08-15 LAB — MAGNESIUM: Magnesium: 1.5 mg/dL — ABNORMAL LOW (ref 1.7–2.4)

## 2023-08-15 LAB — PHOSPHORUS: Phosphorus: 2.7 mg/dL (ref 2.5–4.6)

## 2023-08-15 SURGERY — ERCP, WITH INTERVENTION IF INDICATED
Anesthesia: General

## 2023-08-15 MED ORDER — PHENYLEPHRINE 80 MCG/ML (10ML) SYRINGE FOR IV PUSH (FOR BLOOD PRESSURE SUPPORT)
PREFILLED_SYRINGE | INTRAVENOUS | Status: DC | PRN
  Administered 2023-08-15: 80 ug via INTRAVENOUS

## 2023-08-15 MED ORDER — POTASSIUM CHLORIDE 10 MEQ/100ML IV SOLN
10.0000 meq | INTRAVENOUS | Status: AC
Start: 2023-08-15 — End: 2023-08-15
  Administered 2023-08-15: 10 meq via INTRAVENOUS
  Filled 2023-08-15 (×2): qty 100

## 2023-08-15 MED ORDER — LIDOCAINE 2% (20 MG/ML) 5 ML SYRINGE
INTRAMUSCULAR | Status: DC | PRN
  Administered 2023-08-15: 80 mg via INTRAVENOUS

## 2023-08-15 MED ORDER — CIPROFLOXACIN IN D5W 400 MG/200ML IV SOLN
INTRAVENOUS | Status: AC
  Filled 2023-08-15: qty 200

## 2023-08-15 MED ORDER — ROCURONIUM BROMIDE 10 MG/ML (PF) SYRINGE
PREFILLED_SYRINGE | INTRAVENOUS | Status: DC | PRN
  Administered 2023-08-15: 50 mg via INTRAVENOUS

## 2023-08-15 MED ORDER — POTASSIUM CHLORIDE 10 MEQ/100ML IV SOLN
10.0000 meq | INTRAVENOUS | Status: AC
  Administered 2023-08-15 (×3): 10 meq via INTRAVENOUS
  Filled 2023-08-15 (×2): qty 100

## 2023-08-15 MED ORDER — PROPOFOL 10 MG/ML IV BOLUS
INTRAVENOUS | Status: DC | PRN
  Administered 2023-08-15: 120 mg via INTRAVENOUS

## 2023-08-15 MED ORDER — DICLOFENAC SUPPOSITORY 100 MG
RECTAL | Status: AC
  Filled 2023-08-15: qty 1

## 2023-08-15 MED ORDER — POTASSIUM CHLORIDE 10 MEQ/100ML IV SOLN: 10.0000 meq | INTRAVENOUS | Status: DC

## 2023-08-15 MED ORDER — SODIUM CHLORIDE 0.9 % IV SOLN
INTRAVENOUS | Status: DC | PRN
  Administered 2023-08-15: 25 mL

## 2023-08-15 MED ORDER — SUGAMMADEX SODIUM 200 MG/2ML IV SOLN
INTRAVENOUS | Status: DC | PRN
Start: 2023-08-15 — End: 2023-08-15
  Administered 2023-08-15: 200 mg via INTRAVENOUS

## 2023-08-15 MED ORDER — INDOMETHACIN 50 MG RE SUPP
RECTAL | Status: DC | PRN
  Administered 2023-08-15: 100 mg via RECTAL

## 2023-08-15 MED ORDER — ACETAMINOPHEN 500 MG PO TABS
1000.0000 mg | ORAL_TABLET | Freq: Once | ORAL | Status: AC
  Administered 2023-08-15: 1000 mg via ORAL
  Filled 2023-08-15: qty 2

## 2023-08-15 MED ORDER — INDOMETHACIN 50 MG RE SUPP
RECTAL | Status: AC
  Filled 2023-08-15: qty 2

## 2023-08-15 MED ORDER — MAGNESIUM SULFATE 2 GM/50ML IV SOLN
2.0000 g | Freq: Once | INTRAVENOUS | Status: AC
  Administered 2023-08-15: 2 g via INTRAVENOUS
  Filled 2023-08-15: qty 50

## 2023-08-15 MED ORDER — GLUCAGON HCL RDNA (DIAGNOSTIC) 1 MG IJ SOLR
INTRAMUSCULAR | Status: AC
  Filled 2023-08-15: qty 1

## 2023-08-15 NOTE — Anesthesia Preprocedure Evaluation (Addendum)
 Anesthesia Evaluation  Patient identified by MRN, date of birth, ID band Patient awake    Reviewed: Allergy & Precautions, NPO status , Patient's Chart, lab work & pertinent test results  Airway Mallampati: III  TM Distance: >3 FB Neck ROM: Full    Dental  (+) Dental Advisory Given, Loose, Missing, Poor Dentition, Chipped,    Pulmonary neg pulmonary ROS   breath sounds clear to auscultation       Cardiovascular hypertension, Pt. on home beta blockers and Pt. on medications + CAD and + Cardiac Stents  Normal cardiovascular exam Rhythm:Regular Rate:Normal     Neuro/Psych  PSYCHIATRIC DISORDERS Anxiety Depression    negative neurological ROS     GI/Hepatic ,GERD  ,,(+) Hepatitis -, C  Endo/Other  diabetes    Renal/GU Renal disease     Musculoskeletal negative musculoskeletal ROS (+)    Abdominal   Peds  Hematology negative hematology ROS (+)   Anesthesia Other Findings   Reproductive/Obstetrics                             Anesthesia Physical Anesthesia Plan  ASA: 3  Anesthesia Plan: General   Post-op Pain Management: Minimal or no pain anticipated   Induction: Intravenous  PONV Risk Score and Plan: 2 and Ondansetron, Dexamethasone and Treatment may vary due to age or medical condition  Airway Management Planned: Oral ETT  Additional Equipment:   Intra-op Plan:   Post-operative Plan: Extubation in OR  Informed Consent: I have reviewed the patients History and Physical, chart, labs and discussed the procedure including the risks, benefits and alternatives for the proposed anesthesia with the patient or authorized representative who has indicated his/her understanding and acceptance.     Dental advisory given  Plan Discussed with: CRNA  Anesthesia Plan Comments:        Anesthesia Quick Evaluation

## 2023-08-15 NOTE — Progress Notes (Signed)
 PROGRESS NOTE    [name]  ZOX:[id number] DOB: 1955/10/02 DOA: 08/13/2023 PCP: [facility]    Brief Narrative:  68 year old with history of rectal adenocarcinoma treated with excision and chemo, insulin-dependent type 2 diabetes, chronic hepatitis C infection, GERD and hyperlipidemia presented with multiple episodes of vomiting and epigastric pain. He was tachycardic, temperature 101.7 with a stable blood pressures in the ER. On room air. CT scan consistent with cholelithiasis and cholecystitis. Bilirubin 5. MRCP positive for choledocholithiasis. Started on broad-spectrum antibiotics after drawing blood cultures. GI and surgery consulted. Patient is a scheduled to undergo ERCP today.   Subjective: Patient seen in the morning rounds.  He was sleepy.  He was slightly irritable today because of everything going on with him. Blood cultures with Klebsiella. Creatinine 1.57.   Assessment & Plan:   Acute calculus cholecystitis with choledocholithiasis: Klebsiella pneumonia bacteremia Sepsis present on admission secondary to cholecystitis, Klebsiella bacteremia. N.p.o., IV fluids, adequate pain medications. Patient on Rocephin and Flagyl.  Discontinue Flagyl.  Rocephin 2 g daily should cover for bacteremia.  Will continue until surgical resection. Patient for ERCP today and likely lap chole tomorrow.  GI and surgery following.  Hypokalemia/hypomagnesemia: Replace aggressively today.  Recheck levels.  Type 2 diabetes: On insulin at home.  Currently NPO.  Will keep on sliding scale insulin.  AKI on CKD stage IIIa: Baseline creatinine about 1.39.  He was treated with IV fluids and trending down.  Continue maintenance IV fluids.  Neuropathic pain: On Robaxin and gabapentin.  Continue.    DVT prophylaxis: SCDs Start: 08/14/23 0417 Place TED hose Start: 08/14/23 0417   Code Status: Full code Family Communication: None at the bedside Disposition Plan: Status is:  Inpatient Remains inpatient appropriate because: Inpatient procedures planned     Consultants:  GI Surgery  Procedures:  ERCP planned  Antimicrobials:  Rocephin and Flagyl 2/26---     Objective: Vitals:   08/14/23 1938 08/14/23 2356 08/15/23 0441 08/15/23 0828  BP: 136/86 125/76 (!) 142/85 (!) 147/94  Pulse: (!) 105 100 (!) 106 100  Resp: 18 18 19    Temp: 98.4 F (36.9 C) 98.4 F (36.9 C) (!) 100.7 F (38.2 C) 98.5 F (36.9 C)  TempSrc:  Oral Oral   SpO2: 97% 94% 94% 94%  Weight:      Height:        Intake/Output Summary (Last 24 hours) at 08/15/2023 1014 Last data filed at 08/14/2023 1735 Gross per 24 hour  Intake 3197.35 ml  Output --  Net 3197.35 ml   Filed Weights   08/13/23 1852  Weight: 100.7 kg    Examination:  General exam: Appears appropriately anxious on his stimulation.  Calm and comfortable otherwise. Respiratory system: Clear to auscultation. Respiratory effort normal. Cardiovascular system: S1 & S2 heard, RRR. No JVD, murmurs, rubs, gallops or clicks. No pedal edema. Gastrointestinal system: Mild epigastric tenderness.  No rigidity or guarding.  Bowel sound present. Central nervous system: Alert and oriented. No focal neurological deficits. Extremities: Symmetric 5 x 5 power. Skin: No rashes, lesions or ulcers Psychiatry: Judgement and insight appear normal. Mood & affect appropriate.     Data Reviewed: I have personally reviewed following labs and imaging studies  CBC: Recent Labs  Lab 08/13/23 1856 08/14/23 0222 08/14/23 0550 08/15/23 0752  WBC 1.4* 4.0 5.6 3.9*  NEUTROABS  --  3.9  --   --   HGB 10.7* 9.4* 9.9* 8.9*  HCT 30.5* 26.9* 28.1* 24.8*  MCV 105.9* 105.5*  105.2* 103.3*  PLT 189 164 167 127*   Basic Metabolic Panel: Recent Labs  Lab 08/13/23 1856 08/14/23 0550 08/15/23 0752  NA 135 133* 138  K 3.2* 4.0 3.3*  CL 102 100 105  CO2 19* 18* 17*  GLUCOSE 201* 228* 113*  BUN 18 21 22   CREATININE 1.39* 1.92* 1.57*   CALCIUM 8.5* 8.2* 8.3*  MG  --   --  1.5*  PHOS  --   --  2.7   GFR: Estimated Creatinine Clearance: 57 mL/min (A) (by C-G formula based on SCr of 1.57 mg/dL (H)). Liver Function Tests: Recent Labs  Lab 08/14/23 0222 08/14/23 0550 08/15/23 0752  AST 117* 107* 41  ALT 36 38 24  ALKPHOS 93 92 67  BILITOT 8.8* 8.6* 5.7*  PROT 6.3* 6.5 5.9*  ALBUMIN 3.2* 3.2* 2.6*   Recent Labs  Lab 08/13/23 2144  LIPASE 29   No results for input(s): "AMMONIA" in the last 168 hours. Coagulation Profile: Recent Labs  Lab 08/13/23 2144  INR 1.2   Cardiac Enzymes: No results for input(s): "CKTOTAL", "CKMB", "CKMBINDEX", "TROPONINI" in the last 168 hours. BNP (last 3 results) No results for input(s): "PROBNP" in the last 8760 hours. HbA1C: No results for input(s): "HGBA1C" in the last 72 hours. CBG: Recent Labs  Lab 08/14/23 1713 08/14/23 1938 08/14/23 2354 08/15/23 0419 08/15/23 0827  GLUCAP 112* 189* 92 106* 117*   Lipid Profile: No results for input(s): "CHOL", "HDL", "LDLCALC", "TRIG", "CHOLHDL", "LDLDIRECT" in the last 72 hours. Thyroid Function Tests: No results for input(s): "TSH", "T4TOTAL", "FREET4", "T3FREE", "THYROIDAB" in the last 72 hours. Anemia Panel: No results for input(s): "VITAMINB12", "FOLATE", "FERRITIN", "TIBC", "IRON", "RETICCTPCT" in the last 72 hours. Sepsis Labs: Recent Labs  Lab 08/13/23 1907 08/13/23 2216  LATICACIDVEN 2.6* 1.5    Recent Results (from the past 240 hours)  Resp panel by RT-PCR (RSV, Flu A&B, Covid) Anterior Nasal Swab     Status: None   Collection Time: 08/13/23  9:44 PM   Specimen: Anterior Nasal Swab  Result Value Ref Range Status   SARS Coronavirus 2 by RT PCR NEGATIVE NEGATIVE Final   Influenza A by PCR NEGATIVE NEGATIVE Final   Influenza B by PCR NEGATIVE NEGATIVE Final    Comment: (NOTE) The Xpert Xpress SARS-CoV-2/FLU/RSV plus assay is intended as an aid in the diagnosis of influenza from Nasopharyngeal swab specimens  and should not be used as a sole basis for treatment. Nasal washings and aspirates are unacceptable for Xpert Xpress SARS-CoV-2/FLU/RSV testing.  Fact Sheet for Patients: BloggerCourse.com  Fact Sheet for Healthcare Providers: SeriousBroker.it  This test is not yet approved or cleared by the [place] FDA and has been authorized for detection and/or diagnosis of SARS-CoV-2 by FDA under an Emergency Use Authorization (EUA). This EUA will remain in effect (meaning this test can be used) for the duration of the COVID-19 declaration under Section 564(b)(1) of the Act, 21 U.S.C. section 360bbb-3(b)(1), unless the authorization is terminated or revoked.     Resp Syncytial Virus by PCR NEGATIVE NEGATIVE Final    Comment: (NOTE) Fact Sheet for Patients: BloggerCourse.com  Fact Sheet for Healthcare Providers: SeriousBroker.it  This test is not yet approved or cleared by the [place] FDA and has been authorized for detection and/or diagnosis of SARS-CoV-2 by FDA under an Emergency Use Authorization (EUA). This EUA will remain in effect (meaning this test can be used) for the duration of the COVID-19 declaration under Section 564(b)(1) of the  Act, 21 U.S.C. section 360bbb-3(b)(1), unless the authorization is terminated or revoked.  Performed at [facility], [address] [place], [place] [address]   Blood Culture (routine x 2)     Status: Abnormal (Preliminary result)   Collection Time: 08/13/23  9:44 PM   Specimen: BLOOD LEFT ARM  Result Value Ref Range Status   Specimen Description BLOOD LEFT ARM  Final   Special Requests   Final    BOTTLES DRAWN AEROBIC AND ANAEROBIC Blood Culture results may not be optimal due to an inadequate volume of blood received in culture bottles   Culture  Setup Time   Final    GRAM NEGATIVE RODS IN BOTH AEROBIC AND ANAEROBIC  BOTTLES CRITICAL RESULT CALLED TO, READ BACK BY AND VERIFIED WITH: PHARMD JENNY ON [id number] @1158  BY SM    Culture (A)  Final    KLEBSIELLA PNEUMONIAE SUSCEPTIBILITIES TO FOLLOW Performed at [facility], [address], [place], [place] [address]    Report Status PENDING  Incomplete  Blood Culture (routine x 2)     Status: Abnormal (Preliminary result)   Collection Time: 08/13/23  9:44 PM   Specimen: BLOOD LEFT ARM  Result Value Ref Range Status   Specimen Description BLOOD LEFT ARM  Final   Special Requests   Final    BOTTLES DRAWN AEROBIC AND ANAEROBIC Blood Culture adequate volume   Culture  Setup Time   Final    GRAM NEGATIVE RODS AEROBIC BOTTLE ONLY CRITICAL VALUE NOTED.  VALUE IS CONSISTENT WITH PREVIOUSLY REPORTED AND CALLED VALUE. Performed at [facility], [address], [place], [place] [address]    Culture KLEBSIELLA PNEUMONIAE (A)  Final   Report Status PENDING  Incomplete  Blood Culture ID Panel (Reflexed)     Status: Abnormal   Collection Time: 08/13/23  9:44 PM  Result Value Ref Range Status   Enterococcus faecalis NOT DETECTED NOT DETECTED Final   Enterococcus Faecium NOT DETECTED NOT DETECTED Final   Listeria monocytogenes NOT DETECTED NOT DETECTED Final   Staphylococcus species NOT DETECTED NOT DETECTED Final   Staphylococcus aureus (BCID) NOT DETECTED NOT DETECTED Final   Staphylococcus epidermidis NOT DETECTED NOT DETECTED Final   Staphylococcus lugdunensis NOT DETECTED NOT DETECTED Final   Streptococcus species NOT DETECTED NOT DETECTED Final   Streptococcus agalactiae NOT DETECTED NOT DETECTED Final   Streptococcus pneumoniae NOT DETECTED NOT DETECTED Final   Streptococcus pyogenes NOT DETECTED NOT DETECTED Final   A.calcoaceticus-baumannii NOT DETECTED NOT DETECTED Final   Bacteroides fragilis NOT DETECTED NOT DETECTED Final   Enterobacterales DETECTED (A) NOT DETECTED Final    Comment: Enterobacterales represent a large order of gram  negative bacteria, not a single organism. CRITICAL RESULT CALLED TO, READ BACK BY AND VERIFIED WITH: PHARMD JENNY Z ON [id number] @1158  BY SM    Enterobacter cloacae complex NOT DETECTED NOT DETECTED Final   Escherichia coli NOT DETECTED NOT DETECTED Final   Klebsiella aerogenes NOT DETECTED NOT DETECTED Final   Klebsiella oxytoca NOT DETECTED NOT DETECTED Final   Klebsiella pneumoniae DETECTED (A) NOT DETECTED Final    Comment: CRITICAL RESULT CALLED TO, READ BACK BY AND VERIFIED WITH: PHARMD JENNY Z ON [id number] @1158  BY SM    Proteus species NOT DETECTED NOT DETECTED Final   Salmonella species NOT DETECTED NOT DETECTED Final   Serratia marcescens NOT DETECTED NOT DETECTED Final   Haemophilus influenzae NOT DETECTED NOT DETECTED Final   Neisseria meningitidis NOT DETECTED NOT  DETECTED Final   Pseudomonas aeruginosa NOT DETECTED NOT DETECTED Final   Stenotrophomonas maltophilia NOT DETECTED NOT DETECTED Final   Candida albicans NOT DETECTED NOT DETECTED Final   Candida auris NOT DETECTED NOT DETECTED Final   Candida glabrata NOT DETECTED NOT DETECTED Final   Candida krusei NOT DETECTED NOT DETECTED Final   Candida parapsilosis NOT DETECTED NOT DETECTED Final   Candida tropicalis NOT DETECTED NOT DETECTED Final   Cryptococcus neoformans/gattii NOT DETECTED NOT DETECTED Final   CTX-M ESBL NOT DETECTED NOT DETECTED Final   Carbapenem resistance IMP NOT DETECTED NOT DETECTED Final   Carbapenem resistance KPC NOT DETECTED NOT DETECTED Final   Carbapenem resistance NDM NOT DETECTED NOT DETECTED Final   Carbapenem resist OXA 48 LIKE NOT DETECTED NOT DETECTED Final   Carbapenem resistance VIM NOT DETECTED NOT DETECTED Final    Comment: Performed at [facility], [address] [place], [place] [address]         Radiology Studies: MR ABDOMEN MRCP W WO CONTAST Result Date: 08/14/2023 CLINICAL DATA:  Cholelithiasis Elevated bilirubin; Cholelithiasis 175970 Biliary obstruction 175970  Elevated bilirubin EXAM: MRI ABDOMEN WITHOUT AND WITH CONTRAST (INCLUDING MRCP) TECHNIQUE: Multiplanar multisequence MR imaging of the abdomen was performed both before and after the administration of intravenous contrast. Heavily T2-weighted images of the biliary and pancreatic ducts were obtained, and three-dimensional MRCP images were rendered by post processing. CONTRAST:  10mL GADAVIST GADOBUTROL 1 MMOL/ML IV SOLN COMPARISON:  CT scan abdomen and pelvis from earlier the same day. FINDINGS: Lower chest: Unremarkable MR appearance to the lung bases. No pleural effusion. No pericardial effusion. Normal heart size. Hepatobiliary: The liver is normal in size and configuration. No intra or extrahepatic bile duct dilation. However, there are at least 3, sub 5 mm calculi in the cystic duct (series 14, images 23-25) and at least 2, sub 5 mm calculi in the distal common bile duct. The gallbladder is physiologically distended. There is focal fundal adenomyomatosis. There are small volume multiple sub 5 mm dependent gallstones. No abnormal gallbladder wall thickening or pericholecystic inflammatory changes. Pancreas: No mass, inflammatory changes or other parenchymal abnormality identified. No main pancreatic duct dilation. Spleen:  Within normal limits in size and appearance. No focal mass. Adrenals/Urinary Tract: Unremarkable adrenal glands. There are multiple bilateral simple renal cysts with largest arising from the left kidney upper pole, posteriorly measuring up to 3.5 x 5.1 cm. No hydroureteronephrosis on either side. Stomach/Bowel: Visualized portions within the abdomen are unremarkable. No disproportionate dilation of bowel loops. Vascular/Lymphatic: No pathologically enlarged lymph nodes identified. No abdominal aortic aneurysm demonstrated. No ascites. Other:  None. Musculoskeletal: No suspicious bone lesions identified. IMPRESSION: 1. There are several, sub 5 mm calculi in the cystic duct and distal common bile  duct. No intra or extrahepatic bile duct dilation. Multiple small volume gallstones without imaging signs of acute cholecystitis. 2. Multiple other nonacute observations, as described above. Electronically Signed   By: Jules Schick M.D.   On: 08/14/2023 08:33   MR 3D Recon At Scanner Result Date: 08/14/2023 CLINICAL DATA:  Cholelithiasis Elevated bilirubin; Cholelithiasis 175970 Biliary obstruction 175970 Elevated bilirubin EXAM: MRI ABDOMEN WITHOUT AND WITH CONTRAST (INCLUDING MRCP) TECHNIQUE: Multiplanar multisequence MR imaging of the abdomen was performed both before and after the administration of intravenous contrast. Heavily T2-weighted images of the biliary and pancreatic ducts were obtained, and three-dimensional MRCP images were rendered by post processing. CONTRAST:  10mL GADAVIST GADOBUTROL 1 MMOL/ML IV SOLN COMPARISON:  CT scan abdomen  and pelvis from earlier the same day. FINDINGS: Lower chest: Unremarkable MR appearance to the lung bases. No pleural effusion. No pericardial effusion. Normal heart size. Hepatobiliary: The liver is normal in size and configuration. No intra or extrahepatic bile duct dilation. However, there are at least 3, sub 5 mm calculi in the cystic duct (series 14, images 23-25) and at least 2, sub 5 mm calculi in the distal common bile duct. The gallbladder is physiologically distended. There is focal fundal adenomyomatosis. There are small volume multiple sub 5 mm dependent gallstones. No abnormal gallbladder wall thickening or pericholecystic inflammatory changes. Pancreas: No mass, inflammatory changes or other parenchymal abnormality identified. No main pancreatic duct dilation. Spleen:  Within normal limits in size and appearance. No focal mass. Adrenals/Urinary Tract: Unremarkable adrenal glands. There are multiple bilateral simple renal cysts with largest arising from the left kidney upper pole, posteriorly measuring up to 3.5 x 5.1 cm. No hydroureteronephrosis on  either side. Stomach/Bowel: Visualized portions within the abdomen are unremarkable. No disproportionate dilation of bowel loops. Vascular/Lymphatic: No pathologically enlarged lymph nodes identified. No abdominal aortic aneurysm demonstrated. No ascites. Other:  None. Musculoskeletal: No suspicious bone lesions identified. IMPRESSION: 1. There are several, sub 5 mm calculi in the cystic duct and distal common bile duct. No intra or extrahepatic bile duct dilation. Multiple small volume gallstones without imaging signs of acute cholecystitis. 2. Multiple other nonacute observations, as described above. Electronically Signed   By: Jules Schick M.D.   On: 08/14/2023 08:33   CT CHEST ABDOMEN PELVIS W CONTRAST Result Date: 08/14/2023 CLINICAL DATA:  Sepsis, shortness of breath, abdominal pain, vomiting EXAM: CT CHEST, ABDOMEN, AND PELVIS WITH CONTRAST TECHNIQUE: Multidetector CT imaging of the chest, abdomen and pelvis was performed following the standard protocol during bolus administration of intravenous contrast. RADIATION DOSE REDUCTION: This exam was performed according to the departmental dose-optimization program which includes automated exposure control, adjustment of the mA and/or kV according to patient size and/or use of iterative reconstruction technique. CONTRAST:  75mL OMNIPAQUE IOHEXOL 350 MG/ML SOLN COMPARISON:  1120 set FINDINGS: CT CHEST FINDINGS Cardiovascular: Heart is normal size. Aorta is normal caliber. No filling defects in the pulmonary arteries to suggest pulmonary emboli. Scattered coronary artery and aortic calcifications. Mediastinum/Nodes: No mediastinal, hilar, or axillary adenopathy. Trachea and esophagus are unremarkable. Thyroid unremarkable. Lungs/Pleura: Dependent atelectasis in the lower lobes. No effusions. Musculoskeletal: Chest wall soft tissues are unremarkable. No acute bony abnormality. CT ABDOMEN PELVIS FINDINGS Hepatobiliary: Multiple gallstones within the gallbladder. A  few small stones noted in the distal common bile duct. Gallbladder wall appears thickened. No biliary ductal dilatation. Pancreas: No focal abnormality or ductal dilatation. Spleen: No focal abnormality.  Normal size. Adrenals/Urinary Tract: Bilateral renal benign cysts. No follow-up imaging recommended. No stones or hydronephrosis. Urinary bladder and adrenal glands unremarkable. Stomach/Bowel: There is mild wall thickening within the rectum with surrounding inflammatory stranding suggesting proctitis. Colonic diverticulosis. No active diverticulitis. Appendix normal. Stomach and small bowel decompressed. No bowel obstruction. Vascular/Lymphatic: Aortic atherosclerosis. No evidence of aneurysm or adenopathy. Reproductive: No visible focal abnormality. Other: No free fluid or free air. Musculoskeletal: No acute bony abnormality. IMPRESSION: Cholelithiasis. A few small stones noted within the cystic duct and common bile duct. Mild gallbladder wall thickening. Findings concerning for possible acute cholecystitis. Rectal wall thickening with surrounding inflammation concerning for proctitis. Colonic diverticulosis. Dependent atelectasis in the lower lobes. Coronary artery disease, aortic atherosclerosis. Electronically Signed   By: Charlett Nose M.D.   On: 08/14/2023  01:21   DG Chest 2 View Result Date: 08/13/2023 CLINICAL DATA:  Short of breath, epigastric pain, vomiting EXAM: CHEST - 2 VIEW COMPARISON:  06/28/2005 FINDINGS: The heart size and mediastinal contours are within normal limits. Both lungs are clear. The visualized skeletal structures are unremarkable. IMPRESSION: No active cardiopulmonary disease. Electronically Signed   By: Sharlet Salina M.D.   On: 08/13/2023 19:37        Scheduled Meds:  diclofenac  100 mg Rectal Once   escitalopram  20 mg Oral Daily   insulin aspart  0-9 Units Subcutaneous Q4H   metoprolol tartrate  25 mg Oral BID   mirtazapine  15 mg Oral QHS   sodium chloride flush  3  mL Intravenous Q12H   Continuous Infusions:  cefTRIAXone (ROCEPHIN)  IV 2 g (08/15/23 0906)   lactated ringers 150 mL (08/14/23 2322)   magnesium sulfate bolus IVPB     potassium chloride       LOS: 1 day    Time spent: 50 minutes    Dorcas Carrow, MD [facility]

## 2023-08-15 NOTE — Interval H&P Note (Signed)
 History and Physical Interval Note:  08/15/2023 12:20 PM  [name] is being evaluated in the endoscopy center admissions area preprocedure.  He was seen by the inpatient GI team for abnormal liver tests and biliary obstruction. Has presented today for surgery, with the diagnosis of , hospital biliary stricture, bile duct stones, bacteremia common bile duct stones, bacteremiacommon bile duct stoned, bacteremia.  The various methods of treatment have been discussed with the patient and family. After consideration of risks, benefits and other options for treatment, the patient has consented to  Procedure(s): ENDOSCOPIC RETROGRADE CHOLANGIOPANCREATOGRAPHY (ERCP) (N/A) as a surgical intervention.  The patient's history has been reviewed, patient examined, no change in status, stable for surgery.  I have reviewed the patient's chart and labs.  Questions were answered to the patient's satisfaction.     Wilhemina Bonito. Eda Keys., M.D. [facility]

## 2023-08-15 NOTE — Anesthesia Postprocedure Evaluation (Signed)
 Anesthesia Post Note  Patient: Stevenson Clinch  Procedure(s) Performed: ENDOSCOPIC RETROGRADE CHOLANGIOPANCREATOGRAPHY (ERCP) SPHINCTEROTOMY REMOVAL OF STONES     Patient location during evaluation: PACU Anesthesia Type: General Level of consciousness: awake and alert Pain management: pain level controlled Vital Signs Assessment: post-procedure vital signs reviewed and stable Respiratory status: spontaneous breathing, nonlabored ventilation and respiratory function stable Cardiovascular status: blood pressure returned to baseline Postop Assessment: no apparent nausea or vomiting Anesthetic complications: no   No notable events documented.  Last Vitals:  Vitals:   08/15/23 1440 08/15/23 1508  BP: (!) 150/79 (!) 158/78  Pulse: 92 89  Resp: (!) 23 19  Temp:  36.6 C  SpO2: 93% 99%    Last Pain:  Vitals:   08/15/23 1508  TempSrc: Oral  PainSc: 0-No pain                 Shanda Howells

## 2023-08-15 NOTE — Transfer of Care (Signed)
 Immediate Anesthesia Transfer of Care Note  Patient: [name]  Procedure(s) Performed: ENDOSCOPIC RETROGRADE CHOLANGIOPANCREATOGRAPHY (ERCP) SPHINCTEROTOMY REMOVAL OF STONES  Patient Location: Endoscopy Unit  Anesthesia Type:General  Level of Consciousness: awake, alert , and oriented  Airway & Oxygen Therapy: Patient Spontanous Breathing and Patient connected to face mask oxygen  Post-op Assessment: Report given to RN and Post -op Vital signs reviewed and stable  Post vital signs: Reviewed and stable  Last Vitals:  Vitals Value Taken Time  BP 143/112 08/15/23 1420  Temp    Pulse 93 08/15/23 1423  Resp 35 08/15/23 1423  SpO2 95 % 08/15/23 1423  Vitals shown include unfiled device data.  Last Pain:  Vitals:   08/15/23 1232  TempSrc: Temporal  PainSc: 7          Complications: No notable events documented.

## 2023-08-15 NOTE — Progress Notes (Signed)
 Progress Note     Subjective: Had some clears yesterday which did not seem to exacerbate his pain but he is having ongoing RUQ abdominal pain. Denies n/v. He has been NPO since MN.  Objective: Vital signs in last 24 hours: Temp:  [98.4 F (36.9 C)-100.7 F (38.2 C)] 100.7 F (38.2 C) (02/28 0441) Pulse Rate:  [92-109] 106 (02/28 0441) Resp:  [17-23] 19 (02/28 0441) BP: (125-155)/(76-117) 142/85 (02/28 0441) SpO2:  [94 %-100 %] 94 % (02/28 0441)    Intake/Output from previous day: 02/27 0701 - 02/28 0700 In: 3197.4 [I.V.:1047.5; IV Piggyback:2149.9] Out: -  Intake/Output this shift: No intake/output data recorded.  PE: General: WD, male who is laying in bed in NAD, scleral icterus present Lungs:   Respiratory effort nonlabored on room air Abd: soft, ND, mild RUQ TTP without rebound or guarding MSK: all 4 extremities are symmetrical with no cyanosis, clubbing, or edema. Skin: warm and dry  Psych: A&Ox3 with an appropriate affect.    Lab Results:  Recent Labs    08/14/23 0222 08/14/23 0550  WBC 4.0 5.6  HGB 9.4* 9.9*  HCT 26.9* 28.1*  PLT 164 167   BMET Recent Labs    08/13/23 1856 08/14/23 0550  NA 135 133*  K 3.2* 4.0  CL 102 100  CO2 19* 18*  GLUCOSE 201* 228*  BUN 18 21  CREATININE 1.39* 1.92*  CALCIUM 8.5* 8.2*   PT/INR Recent Labs    08/13/23 2144  LABPROT 15.9*  INR 1.2   CMP     Component Value Date/Time   NA 133 (L) 08/14/2023 0550   K 4.0 08/14/2023 0550   CL 100 08/14/2023 0550   CO2 18 (L) 08/14/2023 0550   GLUCOSE 228 (H) 08/14/2023 0550   BUN 21 08/14/2023 0550   CREATININE 1.92 (H) 08/14/2023 0550   CALCIUM 8.2 (L) 08/14/2023 0550   PROT 6.5 08/14/2023 0550   ALBUMIN 3.2 (L) 08/14/2023 0550   AST 107 (H) 08/14/2023 0550   ALT 38 08/14/2023 0550   ALKPHOS 92 08/14/2023 0550   BILITOT 8.6 (H) 08/14/2023 0550   GFRNONAA 38 (L) 08/14/2023 0550   GFRAA >60 10/16/2016 2257   Lipase     Component Value Date/Time   LIPASE  29 08/13/2023 2144       Studies/Results: MR ABDOMEN MRCP W WO CONTAST Result Date: 08/14/2023 CLINICAL DATA:  Cholelithiasis Elevated bilirubin; Cholelithiasis 175970 Biliary obstruction 175970 Elevated bilirubin EXAM: MRI ABDOMEN WITHOUT AND WITH CONTRAST (INCLUDING MRCP) TECHNIQUE: Multiplanar multisequence MR imaging of the abdomen was performed both before and after the administration of intravenous contrast. Heavily T2-weighted images of the biliary and pancreatic ducts were obtained, and three-dimensional MRCP images were rendered by post processing. CONTRAST:  10mL GADAVIST GADOBUTROL 1 MMOL/ML IV SOLN COMPARISON:  CT scan abdomen and pelvis from earlier the same day. FINDINGS: Lower chest: Unremarkable MR appearance to the lung bases. No pleural effusion. No pericardial effusion. Normal heart size. Hepatobiliary: The liver is normal in size and configuration. No intra or extrahepatic bile duct dilation. However, there are at least 3, sub 5 mm calculi in the cystic duct (series 14, images 23-25) and at least 2, sub 5 mm calculi in the distal common bile duct. The gallbladder is physiologically distended. There is focal fundal adenomyomatosis. There are small volume multiple sub 5 mm dependent gallstones. No abnormal gallbladder wall thickening or pericholecystic inflammatory changes. Pancreas: No mass, inflammatory changes or other parenchymal abnormality identified. No main pancreatic  duct dilation. Spleen:  Within normal limits in size and appearance. No focal mass. Adrenals/Urinary Tract: Unremarkable adrenal glands. There are multiple bilateral simple renal cysts with largest arising from the left kidney upper pole, posteriorly measuring up to 3.5 x 5.1 cm. No hydroureteronephrosis on either side. Stomach/Bowel: Visualized portions within the abdomen are unremarkable. No disproportionate dilation of bowel loops. Vascular/Lymphatic: No pathologically enlarged lymph nodes identified. No abdominal  aortic aneurysm demonstrated. No ascites. Other:  None. Musculoskeletal: No suspicious bone lesions identified. IMPRESSION: 1. There are several, sub 5 mm calculi in the cystic duct and distal common bile duct. No intra or extrahepatic bile duct dilation. Multiple small volume gallstones without imaging signs of acute cholecystitis. 2. Multiple other nonacute observations, as described above. Electronically Signed   By: Jules Schick M.D.   On: 08/14/2023 08:33   MR 3D Recon At Scanner Result Date: 08/14/2023 CLINICAL DATA:  Cholelithiasis Elevated bilirubin; Cholelithiasis 175970 Biliary obstruction 175970 Elevated bilirubin EXAM: MRI ABDOMEN WITHOUT AND WITH CONTRAST (INCLUDING MRCP) TECHNIQUE: Multiplanar multisequence MR imaging of the abdomen was performed both before and after the administration of intravenous contrast. Heavily T2-weighted images of the biliary and pancreatic ducts were obtained, and three-dimensional MRCP images were rendered by post processing. CONTRAST:  10mL GADAVIST GADOBUTROL 1 MMOL/ML IV SOLN COMPARISON:  CT scan abdomen and pelvis from earlier the same day. FINDINGS: Lower chest: Unremarkable MR appearance to the lung bases. No pleural effusion. No pericardial effusion. Normal heart size. Hepatobiliary: The liver is normal in size and configuration. No intra or extrahepatic bile duct dilation. However, there are at least 3, sub 5 mm calculi in the cystic duct (series 14, images 23-25) and at least 2, sub 5 mm calculi in the distal common bile duct. The gallbladder is physiologically distended. There is focal fundal adenomyomatosis. There are small volume multiple sub 5 mm dependent gallstones. No abnormal gallbladder wall thickening or pericholecystic inflammatory changes. Pancreas: No mass, inflammatory changes or other parenchymal abnormality identified. No main pancreatic duct dilation. Spleen:  Within normal limits in size and appearance. No focal mass. Adrenals/Urinary Tract:  Unremarkable adrenal glands. There are multiple bilateral simple renal cysts with largest arising from the left kidney upper pole, posteriorly measuring up to 3.5 x 5.1 cm. No hydroureteronephrosis on either side. Stomach/Bowel: Visualized portions within the abdomen are unremarkable. No disproportionate dilation of bowel loops. Vascular/Lymphatic: No pathologically enlarged lymph nodes identified. No abdominal aortic aneurysm demonstrated. No ascites. Other:  None. Musculoskeletal: No suspicious bone lesions identified. IMPRESSION: 1. There are several, sub 5 mm calculi in the cystic duct and distal common bile duct. No intra or extrahepatic bile duct dilation. Multiple small volume gallstones without imaging signs of acute cholecystitis. 2. Multiple other nonacute observations, as described above. Electronically Signed   By: Jules Schick M.D.   On: 08/14/2023 08:33   CT CHEST ABDOMEN PELVIS W CONTRAST Result Date: 08/14/2023 CLINICAL DATA:  Sepsis, shortness of breath, abdominal pain, vomiting EXAM: CT CHEST, ABDOMEN, AND PELVIS WITH CONTRAST TECHNIQUE: Multidetector CT imaging of the chest, abdomen and pelvis was performed following the standard protocol during bolus administration of intravenous contrast. RADIATION DOSE REDUCTION: This exam was performed according to the departmental dose-optimization program which includes automated exposure control, adjustment of the mA and/or kV according to patient size and/or use of iterative reconstruction technique. CONTRAST:  75mL OMNIPAQUE IOHEXOL 350 MG/ML SOLN COMPARISON:  1120 set FINDINGS: CT CHEST FINDINGS Cardiovascular: Heart is normal size. Aorta is normal caliber. No filling defects  in the pulmonary arteries to suggest pulmonary emboli. Scattered coronary artery and aortic calcifications. Mediastinum/Nodes: No mediastinal, hilar, or axillary adenopathy. Trachea and esophagus are unremarkable. Thyroid unremarkable. Lungs/Pleura: Dependent atelectasis in the  lower lobes. No effusions. Musculoskeletal: Chest wall soft tissues are unremarkable. No acute bony abnormality. CT ABDOMEN PELVIS FINDINGS Hepatobiliary: Multiple gallstones within the gallbladder. A few small stones noted in the distal common bile duct. Gallbladder wall appears thickened. No biliary ductal dilatation. Pancreas: No focal abnormality or ductal dilatation. Spleen: No focal abnormality.  Normal size. Adrenals/Urinary Tract: Bilateral renal benign cysts. No follow-up imaging recommended. No stones or hydronephrosis. Urinary bladder and adrenal glands unremarkable. Stomach/Bowel: There is mild wall thickening within the rectum with surrounding inflammatory stranding suggesting proctitis. Colonic diverticulosis. No active diverticulitis. Appendix normal. Stomach and small bowel decompressed. No bowel obstruction. Vascular/Lymphatic: Aortic atherosclerosis. No evidence of aneurysm or adenopathy. Reproductive: No visible focal abnormality. Other: No free fluid or free air. Musculoskeletal: No acute bony abnormality. IMPRESSION: Cholelithiasis. A few small stones noted within the cystic duct and common bile duct. Mild gallbladder wall thickening. Findings concerning for possible acute cholecystitis. Rectal wall thickening with surrounding inflammation concerning for proctitis. Colonic diverticulosis. Dependent atelectasis in the lower lobes. Coronary artery disease, aortic atherosclerosis. Electronically Signed   By: Charlett Nose M.D.   On: 08/14/2023 01:21   DG Chest 2 View Result Date: 08/13/2023 CLINICAL DATA:  Short of breath, epigastric pain, vomiting EXAM: CHEST - 2 VIEW COMPARISON:  06/28/2005 FINDINGS: The heart size and mediastinal contours are within normal limits. Both lungs are clear. The visualized skeletal structures are unremarkable. IMPRESSION: No active cardiopulmonary disease. Electronically Signed   By: Sharlet Salina M.D.   On: 08/13/2023 19:37    Anti-infectives: Anti-infectives  (From admission, onward)    Start     Dose/Rate Route Frequency Ordered Stop   08/14/23 1000  cefTRIAXone (ROCEPHIN) 2 g in sodium chloride 0.9 % 100 mL IVPB        2 g 200 mL/hr over 30 Minutes Intravenous Every 24 hours 08/14/23 0416 08/21/23 0959   08/14/23 1000  metroNIDAZOLE (FLAGYL) IVPB 500 mg        500 mg 100 mL/hr over 60 Minutes Intravenous Every 12 hours 08/14/23 0416 08/21/23 0959   08/13/23 2200  ceFEPIme (MAXIPIME) 2 g in sodium chloride 0.9 % 100 mL IVPB        2 g 200 mL/hr over 30 Minutes Intravenous  Once 08/13/23 2146 08/13/23 2315   08/13/23 2200  metroNIDAZOLE (FLAGYL) IVPB 500 mg        500 mg 100 mL/hr over 60 Minutes Intravenous  Once 08/13/23 2146 08/14/23 0026   08/13/23 2200  vancomycin (VANCOCIN) IVPB 1000 mg/200 mL premix        1,000 mg 200 mL/hr over 60 Minutes Intravenous  Once 08/13/23 2146 08/14/23 0208        Assessment/Plan Cholelithiasis choledocholithiasis  Jaundice  - GI planning ERCP today - labs not done yet today. T bili 8.6 yesterday - can have CLD after ERCP and NPO MN for possible lap chole tomorrow  I have explained the procedure, risks, and aftercare of cholecystectomy.  Risks include but are not limited to bleeding, infection, wound problems, anesthesia, diarrhea, bile leak, injury to common bile duct/liver/intestine.  He seems to understand and agrees to proceed.  FEN: NPO ID: rocephin/flagyl VTE: okay for chemical ppx from surgical standpoint  - per primary- chronic hepatitis C HLD substance abuse HTN GERD\ GIB colorectal  cancer s/p LAR   I reviewed Consultant GI notes, hospitalist notes, last 24 h vitals and pain scores, last 48 h intake and output, last 24 h labs and trends, and last 24 h imaging results.     LOS: 1 day   Eric Form, [facility] 08/15/2023, 8:07 AM Please see Amion for pager number during day hours 7:00am-4:30pm

## 2023-08-15 NOTE — Progress Notes (Signed)
 Telemetry notified Assigned RN that patient had a 18 beat run of SVT, HR in the 158 non sustained. On call MD notified. Assigned RN informed to ask telemetry to send the strip so that MD can see, give patient ordered Tylenol, and labs ordered. Patient given ordered Tylenol, and telemetry notified to send the strip. Labs will draw labs. Will continue to monitor this shift.

## 2023-08-15 NOTE — Progress Notes (Signed)
 Pt back from procedure VSS,

## 2023-08-15 NOTE — Op Note (Signed)
 [facility] Patient Name: [name] Procedure Date : 08/15/2023 MRN: [id number] Attending MD: Wilhemina Bonito. Marina Goodell , MD, [id number] Date of Birth: [birth year] CSN: [id number] Age: 68 Admit Type: Inpatient Procedure:                ERCP with biliary sphincterotomy and common duct                            stone extraction Indications:              Bile duct stone on MRCP, Abnormal liver function                            test, abdominal pain Providers:                Wilhemina Bonito. Marina Goodell, MD, Rogue Jury, RN, Alan Ripper,                            Technician Referring MD:             [facility] surgery Medicines:                General Anesthesia Complications:            No immediate complications. Estimated Blood Loss:     Estimated blood loss: none. Procedure:                Pre-Anesthesia Assessment:                           - Prior to the procedure, a History and Physical                            was performed, and patient medications and                            allergies were reviewed. The patient is competent.                            The risks and benefits of the procedure and the                            sedation options and risks were discussed with the                            patient. All questions were answered and informed                            consent was obtained. Patient identification and                            proposed procedure were verified by the physician.                            Mental Status Examination: alert and oriented.  Airway Examination: normal oropharyngeal airway and                            neck mobility. Respiratory Examination: clear to                            auscultation. CV Examination: normal. Prophylactic                            Antibiotics: The patient does not require                            prophylactic antibiotics. Prior Anticoagulants: The                             patient has taken no anticoagulant or antiplatelet                            agents. ASA Grade Assessment: II - A patient with                            mild systemic disease. After reviewing the risks                            and benefits, the patient was deemed in                            satisfactory condition to undergo the procedure.                            The anesthesia plan was to use moderate sedation /                            analgesia (conscious sedation). Immediately prior                            to administration of medications, the patient was                            re-assessed for adequacy to receive sedatives. The                            heart rate, respiratory rate, oxygen saturations,                            blood pressure, adequacy of pulmonary ventilation,                            and response to care were monitored throughout the                            procedure. The physical status of the patient was  re-assessed after the procedure.                           After obtaining informed consent, the scope was                            passed under direct vision. Throughout the                            procedure, the patient's blood pressure, pulse, and                            oxygen saturations were monitored continuously. The                            [id number]-[id number] ([id number] Olympus duodenoscope was                            introduced through the mouth, and used to inject                            contrast into and used to inject contrast into the                            bile duct. The ERCP was accomplished without                            difficulty. The patient tolerated the procedure                            well. Scope In: Scope Out: Findings:      1. The side-viewing endoscope was passed blindly into the esophagus. The       stomach, duodenum, and major ampulla were unremarkable. The minor        ampulla was not sought.      2. A scout radiograph of the abdomen with the endoscope and position was       unremarkable      3. On the first pass of the sphincterotome the common duct was deeply       cannulated. Injection of contrast filled the biliary tree which was       minimally dilated. Over time there was slight filling of the cystic       duct. One 3 mm filling defect was noted.      4. A moderately large biliary sphincterotomy was made by cutting in the       12:00 orientation utilizing the ERBE system. No bleeding.      5. A 12-15 mm sequential balloon was pulled through the bile duct       multiple times. A small stone was extracted. The bile duct did have       purulent material present.      6. Postextraction occlusion cholangiogram was unremarkable. There was       rapid drainage of the bile duct post sphincterotomy and stone extraction.      7. There was no injection or manipulation of the pancreatic duct Impression:  1. Choledocholithiasis status post ERC with                            sphincterotomy and common duct stone extraction.                           2. Cholelithiasis with probable cholecystitis Recommendation:           1. Standard post ERCP care                           2. Continue antibiotics                           3. Laparoscopic cholecystectomy per general surgery                           4. Inpatient GI team available if needed.                           . Procedure Code(s):        --- Professional ---                           [id number], Endoscopic retrograde                            cholangiopancreatography (ERCP); with removal of                            calculi/debris from biliary/pancreatic duct(s)                           43262, Endoscopic retrograde                            cholangiopancreatography (ERCP); with                            sphincterotomy/papillotomy Diagnosis Code(s):        --- Professional ---                            K80.50, Calculus of bile duct without cholangitis                            or cholecystitis without obstruction                           R79.89, Other specified abnormal findings of blood                            chemistry                           K83.8, Other specified diseases of biliary tract CPT copyright 2022 [employer]. All rights reserved. The codes documented in this report are preliminary and upon coder review may  be revised to meet current compliance requirements. [name]  Donald Ou, MD 08/15/2023 2:32:52 PM This report has been signed electronically. Number of Addenda: 0

## 2023-08-15 NOTE — Progress Notes (Signed)
 Pt off unit. Going to procedure VSS, BG stable,

## 2023-08-15 NOTE — Plan of Care (Signed)
  Problem: Education: Goal: Ability to describe self-care measures that may prevent or decrease complications (Diabetes Survival Skills Education) will improve Outcome: Progressing   Problem: Coping: Goal: Ability to adjust to condition or change in health will improve Outcome: Progressing   Problem: Metabolic: Goal: Ability to maintain appropriate glucose levels will improve Outcome: Progressing   Problem: Nutritional: Goal: Maintenance of adequate nutrition will improve Outcome: Progressing

## 2023-08-15 NOTE — H&P (View-Only) (Signed)
 Progress Note     Subjective: Had some clears yesterday which did not seem to exacerbate his pain but he is having ongoing RUQ abdominal pain. Denies n/v. He has been NPO since MN.  Objective: Vital signs in last 24 hours: Temp:  [98.4 F (36.9 C)-100.7 F (38.2 C)] 100.7 F (38.2 C) (02/28 0441) Pulse Rate:  [92-109] 106 (02/28 0441) Resp:  [17-23] 19 (02/28 0441) BP: (125-155)/(76-117) 142/85 (02/28 0441) SpO2:  [94 %-100 %] 94 % (02/28 0441)    Intake/Output from previous day: 02/27 0701 - 02/28 0700 In: 3197.4 [I.V.:1047.5; IV Piggyback:2149.9] Out: -  Intake/Output this shift: No intake/output data recorded.  PE: General: WD, male who is laying in bed in NAD, scleral icterus present Lungs:   Respiratory effort nonlabored on room air Abd: soft, ND, mild RUQ TTP without rebound or guarding MSK: all 4 extremities are symmetrical with no cyanosis, clubbing, or edema. Skin: warm and dry  Psych: A&Ox3 with an appropriate affect.    Lab Results:  Recent Labs    08/14/23 0222 08/14/23 0550  WBC 4.0 5.6  HGB 9.4* 9.9*  HCT 26.9* 28.1*  PLT 164 167   BMET Recent Labs    08/13/23 1856 08/14/23 0550  NA 135 133*  K 3.2* 4.0  CL 102 100  CO2 19* 18*  GLUCOSE 201* 228*  BUN 18 21  CREATININE 1.39* 1.92*  CALCIUM 8.5* 8.2*   PT/INR Recent Labs    08/13/23 2144  LABPROT 15.9*  INR 1.2   CMP     Component Value Date/Time   NA 133 (L) 08/14/2023 0550   K 4.0 08/14/2023 0550   CL 100 08/14/2023 0550   CO2 18 (L) 08/14/2023 0550   GLUCOSE 228 (H) 08/14/2023 0550   BUN 21 08/14/2023 0550   CREATININE 1.92 (H) 08/14/2023 0550   CALCIUM 8.2 (L) 08/14/2023 0550   PROT 6.5 08/14/2023 0550   ALBUMIN 3.2 (L) 08/14/2023 0550   AST 107 (H) 08/14/2023 0550   ALT 38 08/14/2023 0550   ALKPHOS 92 08/14/2023 0550   BILITOT 8.6 (H) 08/14/2023 0550   GFRNONAA 38 (L) 08/14/2023 0550   GFRAA >60 10/16/2016 2257   Lipase     Component Value Date/Time   LIPASE  29 08/13/2023 2144       Studies/Results: MR ABDOMEN MRCP W WO CONTAST Result Date: 08/14/2023 CLINICAL DATA:  Cholelithiasis Elevated bilirubin; Cholelithiasis 175970 Biliary obstruction 175970 Elevated bilirubin EXAM: MRI ABDOMEN WITHOUT AND WITH CONTRAST (INCLUDING MRCP) TECHNIQUE: Multiplanar multisequence MR imaging of the abdomen was performed both before and after the administration of intravenous contrast. Heavily T2-weighted images of the biliary and pancreatic ducts were obtained, and three-dimensional MRCP images were rendered by post processing. CONTRAST:  10mL GADAVIST GADOBUTROL 1 MMOL/ML IV SOLN COMPARISON:  CT scan abdomen and pelvis from earlier the same day. FINDINGS: Lower chest: Unremarkable MR appearance to the lung bases. No pleural effusion. No pericardial effusion. Normal heart size. Hepatobiliary: The liver is normal in size and configuration. No intra or extrahepatic bile duct dilation. However, there are at least 3, sub 5 mm calculi in the cystic duct (series 14, images 23-25) and at least 2, sub 5 mm calculi in the distal common bile duct. The gallbladder is physiologically distended. There is focal fundal adenomyomatosis. There are small volume multiple sub 5 mm dependent gallstones. No abnormal gallbladder wall thickening or pericholecystic inflammatory changes. Pancreas: No mass, inflammatory changes or other parenchymal abnormality identified. No main pancreatic  duct dilation. Spleen:  Within normal limits in size and appearance. No focal mass. Adrenals/Urinary Tract: Unremarkable adrenal glands. There are multiple bilateral simple renal cysts with largest arising from the left kidney upper pole, posteriorly measuring up to 3.5 x 5.1 cm. No hydroureteronephrosis on either side. Stomach/Bowel: Visualized portions within the abdomen are unremarkable. No disproportionate dilation of bowel loops. Vascular/Lymphatic: No pathologically enlarged lymph nodes identified. No abdominal  aortic aneurysm demonstrated. No ascites. Other:  None. Musculoskeletal: No suspicious bone lesions identified. IMPRESSION: 1. There are several, sub 5 mm calculi in the cystic duct and distal common bile duct. No intra or extrahepatic bile duct dilation. Multiple small volume gallstones without imaging signs of acute cholecystitis. 2. Multiple other nonacute observations, as described above. Electronically Signed   By: Jules Schick M.D.   On: 08/14/2023 08:33   MR 3D Recon At Scanner Result Date: 08/14/2023 CLINICAL DATA:  Cholelithiasis Elevated bilirubin; Cholelithiasis 175970 Biliary obstruction 175970 Elevated bilirubin EXAM: MRI ABDOMEN WITHOUT AND WITH CONTRAST (INCLUDING MRCP) TECHNIQUE: Multiplanar multisequence MR imaging of the abdomen was performed both before and after the administration of intravenous contrast. Heavily T2-weighted images of the biliary and pancreatic ducts were obtained, and three-dimensional MRCP images were rendered by post processing. CONTRAST:  10mL GADAVIST GADOBUTROL 1 MMOL/ML IV SOLN COMPARISON:  CT scan abdomen and pelvis from earlier the same day. FINDINGS: Lower chest: Unremarkable MR appearance to the lung bases. No pleural effusion. No pericardial effusion. Normal heart size. Hepatobiliary: The liver is normal in size and configuration. No intra or extrahepatic bile duct dilation. However, there are at least 3, sub 5 mm calculi in the cystic duct (series 14, images 23-25) and at least 2, sub 5 mm calculi in the distal common bile duct. The gallbladder is physiologically distended. There is focal fundal adenomyomatosis. There are small volume multiple sub 5 mm dependent gallstones. No abnormal gallbladder wall thickening or pericholecystic inflammatory changes. Pancreas: No mass, inflammatory changes or other parenchymal abnormality identified. No main pancreatic duct dilation. Spleen:  Within normal limits in size and appearance. No focal mass. Adrenals/Urinary Tract:  Unremarkable adrenal glands. There are multiple bilateral simple renal cysts with largest arising from the left kidney upper pole, posteriorly measuring up to 3.5 x 5.1 cm. No hydroureteronephrosis on either side. Stomach/Bowel: Visualized portions within the abdomen are unremarkable. No disproportionate dilation of bowel loops. Vascular/Lymphatic: No pathologically enlarged lymph nodes identified. No abdominal aortic aneurysm demonstrated. No ascites. Other:  None. Musculoskeletal: No suspicious bone lesions identified. IMPRESSION: 1. There are several, sub 5 mm calculi in the cystic duct and distal common bile duct. No intra or extrahepatic bile duct dilation. Multiple small volume gallstones without imaging signs of acute cholecystitis. 2. Multiple other nonacute observations, as described above. Electronically Signed   By: Jules Schick M.D.   On: 08/14/2023 08:33   CT CHEST ABDOMEN PELVIS W CONTRAST Result Date: 08/14/2023 CLINICAL DATA:  Sepsis, shortness of breath, abdominal pain, vomiting EXAM: CT CHEST, ABDOMEN, AND PELVIS WITH CONTRAST TECHNIQUE: Multidetector CT imaging of the chest, abdomen and pelvis was performed following the standard protocol during bolus administration of intravenous contrast. RADIATION DOSE REDUCTION: This exam was performed according to the departmental dose-optimization program which includes automated exposure control, adjustment of the mA and/or kV according to patient size and/or use of iterative reconstruction technique. CONTRAST:  75mL OMNIPAQUE IOHEXOL 350 MG/ML SOLN COMPARISON:  1120 set FINDINGS: CT CHEST FINDINGS Cardiovascular: Heart is normal size. Aorta is normal caliber. No filling defects  in the pulmonary arteries to suggest pulmonary emboli. Scattered coronary artery and aortic calcifications. Mediastinum/Nodes: No mediastinal, hilar, or axillary adenopathy. Trachea and esophagus are unremarkable. Thyroid unremarkable. Lungs/Pleura: Dependent atelectasis in the  lower lobes. No effusions. Musculoskeletal: Chest wall soft tissues are unremarkable. No acute bony abnormality. CT ABDOMEN PELVIS FINDINGS Hepatobiliary: Multiple gallstones within the gallbladder. A few small stones noted in the distal common bile duct. Gallbladder wall appears thickened. No biliary ductal dilatation. Pancreas: No focal abnormality or ductal dilatation. Spleen: No focal abnormality.  Normal size. Adrenals/Urinary Tract: Bilateral renal benign cysts. No follow-up imaging recommended. No stones or hydronephrosis. Urinary bladder and adrenal glands unremarkable. Stomach/Bowel: There is mild wall thickening within the rectum with surrounding inflammatory stranding suggesting proctitis. Colonic diverticulosis. No active diverticulitis. Appendix normal. Stomach and small bowel decompressed. No bowel obstruction. Vascular/Lymphatic: Aortic atherosclerosis. No evidence of aneurysm or adenopathy. Reproductive: No visible focal abnormality. Other: No free fluid or free air. Musculoskeletal: No acute bony abnormality. IMPRESSION: Cholelithiasis. A few small stones noted within the cystic duct and common bile duct. Mild gallbladder wall thickening. Findings concerning for possible acute cholecystitis. Rectal wall thickening with surrounding inflammation concerning for proctitis. Colonic diverticulosis. Dependent atelectasis in the lower lobes. Coronary artery disease, aortic atherosclerosis. Electronically Signed   By: Charlett Nose M.D.   On: 08/14/2023 01:21   DG Chest 2 View Result Date: 08/13/2023 CLINICAL DATA:  Short of breath, epigastric pain, vomiting EXAM: CHEST - 2 VIEW COMPARISON:  06/28/2005 FINDINGS: The heart size and mediastinal contours are within normal limits. Both lungs are clear. The visualized skeletal structures are unremarkable. IMPRESSION: No active cardiopulmonary disease. Electronically Signed   By: Sharlet Salina M.D.   On: 08/13/2023 19:37    Anti-infectives: Anti-infectives  (From admission, onward)    Start     Dose/Rate Route Frequency Ordered Stop   08/14/23 1000  cefTRIAXone (ROCEPHIN) 2 g in sodium chloride 0.9 % 100 mL IVPB        2 g 200 mL/hr over 30 Minutes Intravenous Every 24 hours 08/14/23 0416 08/21/23 0959   08/14/23 1000  metroNIDAZOLE (FLAGYL) IVPB 500 mg        500 mg 100 mL/hr over 60 Minutes Intravenous Every 12 hours 08/14/23 0416 08/21/23 0959   08/13/23 2200  ceFEPIme (MAXIPIME) 2 g in sodium chloride 0.9 % 100 mL IVPB        2 g 200 mL/hr over 30 Minutes Intravenous  Once 08/13/23 2146 08/13/23 2315   08/13/23 2200  metroNIDAZOLE (FLAGYL) IVPB 500 mg        500 mg 100 mL/hr over 60 Minutes Intravenous  Once 08/13/23 2146 08/14/23 0026   08/13/23 2200  vancomycin (VANCOCIN) IVPB 1000 mg/200 mL premix        1,000 mg 200 mL/hr over 60 Minutes Intravenous  Once 08/13/23 2146 08/14/23 0208        Assessment/Plan Cholelithiasis choledocholithiasis  Jaundice  - GI planning ERCP today - labs not done yet today. T bili 8.6 yesterday - can have CLD after ERCP and NPO MN for possible lap chole tomorrow  I have explained the procedure, risks, and aftercare of cholecystectomy.  Risks include but are not limited to bleeding, infection, wound problems, anesthesia, diarrhea, bile leak, injury to common bile duct/liver/intestine.  He seems to understand and agrees to proceed.  FEN: NPO ID: [email or web address]/flagyl VTE: okay for chemical ppx from surgical standpoint  - per primary- chronic hepatitis C HLD substance abuse HTN GERD\ GIB colorectal  cancer s/p LAR   I reviewed Consultant GI notes, hospitalist notes, last 24 h vitals and pain scores, last 48 h intake and output, last 24 h labs and trends, and last 24 h imaging results.     LOS: 1 day   Eric Form, [facility] 08/15/2023, 8:07 AM Please see Amion for pager number during day hours 7:00am-4:30pm

## 2023-08-15 NOTE — Anesthesia Procedure Notes (Signed)
 Procedure Name: Intubation Date/Time: 08/15/2023 1:01 PM  Performed by: Georgianne Fick D, CRNAPre-anesthesia Checklist: Patient identified, Emergency Drugs available, Suction available and Patient being monitored Patient Re-evaluated:Patient Re-evaluated prior to induction Oxygen Delivery Method: Circle System Utilized Preoxygenation: Pre-oxygenation with 100% oxygen Induction Type: IV induction Ventilation: Mask ventilation without difficulty Laryngoscope Size: Mac and 4 Grade View: Grade II Tube type: Oral Tube size: 7.5 mm Number of attempts: 1 Airway Equipment and Method: Stylet and Oral airway Placement Confirmation: ETT inserted through vocal cords under direct vision, positive ETCO2 and breath sounds checked- equal and bilateral Secured at: 22 cm Tube secured with: Tape Dental Injury: Teeth and Oropharynx as per pre-operative assessment

## 2023-08-16 ENCOUNTER — Inpatient Hospital Stay (HOSPITAL_COMMUNITY)

## 2023-08-16 ENCOUNTER — Encounter (HOSPITAL_COMMUNITY)
Admission: EM | Discharge status: Against Medical Advice | Payer: Self-pay | Source: Home / Self Care | Attending: Internal Medicine

## 2023-08-16 ENCOUNTER — Encounter (HOSPITAL_COMMUNITY): Payer: Self-pay | Admitting: Internal Medicine

## 2023-08-16 ENCOUNTER — Inpatient Hospital Stay (HOSPITAL_COMMUNITY): Admitting: Certified Registered"

## 2023-08-16 DIAGNOSIS — K8050 Calculus of bile duct without cholangitis or cholecystitis without obstruction: Secondary | ICD-10-CM

## 2023-08-16 DIAGNOSIS — K8042 Calculus of bile duct with acute cholecystitis without obstruction: Secondary | ICD-10-CM | POA: Diagnosis not present

## 2023-08-16 HISTORY — PX: LYSIS OF ADHESION: SHX5961

## 2023-08-16 HISTORY — PX: CHOLECYSTECTOMY: SHX55

## 2023-08-16 LAB — GLUCOSE, CAPILLARY
Glucose-Capillary: 103 mg/dL — ABNORMAL HIGH (ref 70–99)
Glucose-Capillary: 132 mg/dL — ABNORMAL HIGH (ref 70–99)
Glucose-Capillary: 146 mg/dL — ABNORMAL HIGH (ref 70–99)
Glucose-Capillary: 171 mg/dL — ABNORMAL HIGH (ref 70–99)
Glucose-Capillary: 166 mg/dL — ABNORMAL HIGH (ref 70–99)
Glucose-Capillary: 267 mg/dL — ABNORMAL HIGH (ref 70–99)
Glucose-Capillary: 199 mg/dL — ABNORMAL HIGH (ref 70–99)

## 2023-08-16 LAB — CBC
HCT: 24.7 % — ABNORMAL LOW (ref 39.0–52.0)
Hemoglobin: 8.9 g/dL — ABNORMAL LOW (ref 13.0–17.0)
MCH: 37.4 pg — ABNORMAL HIGH (ref 26.0–34.0)
MCHC: 36 g/dL (ref 30.0–36.0)
MCV: 103.8 fL — ABNORMAL HIGH (ref 80.0–100.0)
Platelets: 130 10*3/uL — ABNORMAL LOW (ref 150–400)
RBC: 2.38 MIL/uL — ABNORMAL LOW (ref 4.22–5.81)
RDW: 17.4 % — ABNORMAL HIGH (ref 11.5–15.5)
WBC: 3 10*3/uL — ABNORMAL LOW (ref 4.0–10.5)
nRBC: 0 % (ref 0.0–0.2)

## 2023-08-16 LAB — MAGNESIUM: Magnesium: 2 mg/dL (ref 1.7–2.4)

## 2023-08-16 LAB — CULTURE, BLOOD (ROUTINE X 2): Special Requests: ADEQUATE

## 2023-08-16 LAB — HCV RT-PCR, QUANT (NON-GRAPH): Hepatitis C Quantitation: NOT DETECTED [IU]/mL

## 2023-08-16 LAB — COMPREHENSIVE METABOLIC PANEL
ALT: 24 U/L (ref 0–44)
AST: 72 U/L — ABNORMAL HIGH (ref 15–41)
Albumin: 2.5 g/dL — ABNORMAL LOW (ref 3.5–5.0)
Alkaline Phosphatase: 128 U/L — ABNORMAL HIGH (ref 38–126)
Anion gap: 9 (ref 5–15)
BUN: 23 mg/dL (ref 8–23)
CO2: 20 mmol/L — ABNORMAL LOW (ref 22–32)
Calcium: 8 mg/dL — ABNORMAL LOW (ref 8.9–10.3)
Chloride: 106 mmol/L (ref 98–111)
Creatinine, Ser: 1.5 mg/dL — ABNORMAL HIGH (ref 0.61–1.24)
GFR, Estimated: 51 mL/min — ABNORMAL LOW (ref >60)
Glucose, Bld: 175 mg/dL — ABNORMAL HIGH (ref 70–99)
Potassium: 3.9 mmol/L (ref 3.5–5.1)
Sodium: 135 mmol/L (ref 135–145)
Total Bilirubin: 4.7 mg/dL — ABNORMAL HIGH (ref 0.0–1.2)
Total Protein: 5.9 g/dL — ABNORMAL LOW (ref 6.5–8.1)

## 2023-08-16 LAB — HCV AB W REFLEX TO QUANT PCR: HCV Ab: REACTIVE — AB

## 2023-08-16 SURGERY — LAPAROSCOPIC CHOLECYSTECTOMY WITH INTRAOPERATIVE CHOLANGIOGRAM
Anesthesia: General | Site: Abdomen

## 2023-08-16 MED ORDER — ROCURONIUM BROMIDE 10 MG/ML (PF) SYRINGE
PREFILLED_SYRINGE | INTRAVENOUS | Status: DC | PRN
  Administered 2023-08-16: 5 mg via INTRAVENOUS
  Administered 2023-08-16: 60 mg via INTRAVENOUS

## 2023-08-16 MED ORDER — BUPIVACAINE-EPINEPHRINE 0.25% -1:200000 IJ SOLN
INTRAMUSCULAR | Status: DC | PRN
  Administered 2023-08-16: 6 mL

## 2023-08-16 MED ORDER — FENTANYL CITRATE (PF) 250 MCG/5ML IJ SOLN
INTRAMUSCULAR | Status: AC
  Filled 2023-08-16: qty 5

## 2023-08-16 MED ORDER — CHLORHEXIDINE GLUCONATE 0.12 % MT SOLN: 15.0000 mL | Freq: Once | OROMUCOSAL | Status: AC

## 2023-08-16 MED ORDER — ACETAMINOPHEN 10 MG/ML IV SOLN
INTRAVENOUS | Status: AC
Start: 2023-08-16 — End: 2023-08-16
  Filled 2023-08-16: qty 100

## 2023-08-16 MED ORDER — HEMOSTATIC AGENTS (NO CHARGE) OPTIME
TOPICAL | Status: DC | PRN
Start: 2023-08-16 — End: 2023-08-16
  Administered 2023-08-16 (×2): 1 via TOPICAL

## 2023-08-16 MED ORDER — ORAL CARE MOUTH RINSE: 15.0000 mL | Freq: Once | OROMUCOSAL | Status: AC

## 2023-08-16 MED ORDER — ONDANSETRON HCL 4 MG/2ML IJ SOLN
INTRAMUSCULAR | Status: AC
  Filled 2023-08-16: qty 2

## 2023-08-16 MED ORDER — INSULIN ASPART 100 UNIT/ML IJ SOLN
0.0000 [IU] | INTRAMUSCULAR | Status: DC | PRN
  Administered 2023-08-16: 2 [IU] via SUBCUTANEOUS

## 2023-08-16 MED ORDER — VASOPRESSIN 20 UNIT/ML IV SOLN
INTRAVENOUS | Status: AC
  Filled 2023-08-16: qty 1

## 2023-08-16 MED ORDER — PHENYLEPHRINE 80 MCG/ML (10ML) SYRINGE FOR IV PUSH (FOR BLOOD PRESSURE SUPPORT)
PREFILLED_SYRINGE | INTRAVENOUS | Status: AC
  Filled 2023-08-16: qty 10

## 2023-08-16 MED ORDER — MIDAZOLAM HCL 2 MG/2ML IJ SOLN
INTRAMUSCULAR | Status: AC
  Filled 2023-08-16: qty 2

## 2023-08-16 MED ORDER — FENTANYL CITRATE (PF) 100 MCG/2ML IJ SOLN
25.0000 ug | INTRAMUSCULAR | Status: DC | PRN
Start: 2023-08-16 — End: 2023-08-16

## 2023-08-16 MED ORDER — BUPIVACAINE-EPINEPHRINE (PF) 0.25% -1:200000 IJ SOLN
INTRAMUSCULAR | Status: AC
  Filled 2023-08-16: qty 30

## 2023-08-16 MED ORDER — CHLORHEXIDINE GLUCONATE 0.12 % MT SOLN
OROMUCOSAL | Status: AC
  Administered 2023-08-16: 15 mL via OROMUCOSAL
  Filled 2023-08-16: qty 15

## 2023-08-16 MED ORDER — OXYCODONE HCL 5 MG PO TABS
5.0000 mg | ORAL_TABLET | ORAL | Status: DC | PRN
  Administered 2023-08-16 – 2023-08-18 (×4): 5 mg via ORAL
  Filled 2023-08-16 (×4): qty 1

## 2023-08-16 MED ORDER — SUGAMMADEX SODIUM 200 MG/2ML IV SOLN
INTRAVENOUS | Status: AC
  Filled 2023-08-16: qty 2

## 2023-08-16 MED ORDER — LIDOCAINE 2% (20 MG/ML) 5 ML SYRINGE
INTRAMUSCULAR | Status: DC | PRN
  Administered 2023-08-16: 60 mg via INTRAVENOUS

## 2023-08-16 MED ORDER — SUGAMMADEX SODIUM 200 MG/2ML IV SOLN
INTRAVENOUS | Status: DC | PRN
  Administered 2023-08-16 (×2): 100 mg via INTRAVENOUS

## 2023-08-16 MED ORDER — ONDANSETRON HCL 4 MG/2ML IJ SOLN
INTRAMUSCULAR | Status: DC | PRN
  Administered 2023-08-16: 4 mg via INTRAVENOUS

## 2023-08-16 MED ORDER — PROPOFOL 10 MG/ML IV BOLUS
INTRAVENOUS | Status: AC
  Filled 2023-08-16: qty 20

## 2023-08-16 MED ORDER — 0.9 % SODIUM CHLORIDE (POUR BTL) OPTIME
TOPICAL | Status: DC | PRN
  Administered 2023-08-16: 1000 mL

## 2023-08-16 MED ORDER — LACTATED RINGERS IV SOLN: INTRAVENOUS | Status: DC

## 2023-08-16 MED ORDER — FENTANYL CITRATE (PF) 250 MCG/5ML IJ SOLN
INTRAMUSCULAR | Status: DC | PRN
  Administered 2023-08-16 (×2): 100 ug via INTRAVENOUS
  Administered 2023-08-16 (×2): 25 ug via INTRAVENOUS

## 2023-08-16 MED ORDER — MORPHINE SULFATE (PF) 4 MG/ML IV SOLN
4.0000 mg | INTRAVENOUS | Status: DC | PRN
  Administered 2023-08-16 – 2023-08-17 (×2): 4 mg via INTRAVENOUS
  Filled 2023-08-16 (×2): qty 1

## 2023-08-16 MED ORDER — PROPOFOL 10 MG/ML IV BOLUS
INTRAVENOUS | Status: DC | PRN
  Administered 2023-08-16: 50 mg via INTRAVENOUS
  Administered 2023-08-16: 100 mg via INTRAVENOUS

## 2023-08-16 MED ORDER — PHENYLEPHRINE HCL-NACL 20-0.9 MG/250ML-% IV SOLN
INTRAVENOUS | Status: DC | PRN
  Administered 2023-08-16: 10 ug/min via INTRAVENOUS

## 2023-08-16 MED ORDER — ROCURONIUM BROMIDE 10 MG/ML (PF) SYRINGE
PREFILLED_SYRINGE | INTRAVENOUS | Status: AC
  Filled 2023-08-16: qty 10

## 2023-08-16 MED ORDER — DEXAMETHASONE SODIUM PHOSPHATE 10 MG/ML IJ SOLN
INTRAMUSCULAR | Status: AC
  Filled 2023-08-16: qty 1

## 2023-08-16 MED ORDER — DROPERIDOL 2.5 MG/ML IJ SOLN: 0.6250 mg | Freq: Once | INTRAMUSCULAR | Status: DC | PRN

## 2023-08-16 MED ORDER — PHENYLEPHRINE 80 MCG/ML (10ML) SYRINGE FOR IV PUSH (FOR BLOOD PRESSURE SUPPORT)
PREFILLED_SYRINGE | INTRAVENOUS | Status: DC | PRN
  Administered 2023-08-16 (×2): 80 ug via INTRAVENOUS

## 2023-08-16 MED ORDER — LIDOCAINE 2% (20 MG/ML) 5 ML SYRINGE
INTRAMUSCULAR | Status: AC
  Filled 2023-08-16: qty 5

## 2023-08-16 MED ORDER — SODIUM CHLORIDE 0.9 % IR SOLN
Status: DC | PRN
  Administered 2023-08-16 (×2): 1000 mL

## 2023-08-16 MED ORDER — ACETAMINOPHEN 10 MG/ML IV SOLN
INTRAVENOUS | Status: DC | PRN
Start: 2023-08-16 — End: 2023-08-16
  Administered 2023-08-16: 1000 mg via INTRAVENOUS

## 2023-08-16 SURGICAL SUPPLY — 39 items
APPLIER CLIP ROT 10 11.4 M/L (STAPLE) ×2 IMPLANT
BAG COUNTER SPONGE SURGICOUNT (BAG) ×2 IMPLANT
BENZOIN TINCTURE PRP APPL 2/3 (GAUZE/BANDAGES/DRESSINGS) ×2 IMPLANT
CANISTER SUCT 3000ML PPV (MISCELLANEOUS) ×2 IMPLANT
CHLORAPREP W/TINT 26 (MISCELLANEOUS) ×2 IMPLANT
CLIP APPLIE ROT 10 11.4 M/L (STAPLE) ×2 IMPLANT
COVER SURGICAL LIGHT HANDLE (MISCELLANEOUS) ×2 IMPLANT
DRSG TEGADERM 2-3/8X2-3/4 SM (GAUZE/BANDAGES/DRESSINGS) ×6 IMPLANT
DRSG TEGADERM 4X4.75 (GAUZE/BANDAGES/DRESSINGS) ×2 IMPLANT
ELECT REM PT RETURN 9FT ADLT (ELECTROSURGICAL) ×2 IMPLANT
ELECTRODE REM PT RTRN 9FT ADLT (ELECTROSURGICAL) ×2 IMPLANT
ENDOLOOP SUT PDS II 0 18 (SUTURE) IMPLANT
GAUZE SPONGE 2X2 8PLY STRL LF (GAUZE/BANDAGES/DRESSINGS) ×2 IMPLANT
GLOVE BIO SURGEON STRL SZ7 (GLOVE) ×2 IMPLANT
GLOVE BIOGEL PI IND STRL 7.5 (GLOVE) ×2 IMPLANT
GOWN STRL REUS W/ TWL LRG LVL3 (GOWN DISPOSABLE) ×6 IMPLANT
HEMOSTAT SNOW SURGICEL 2X4 (HEMOSTASIS) IMPLANT
IRRIG SUCT STRYKERFLOW 2 WTIP (MISCELLANEOUS) ×2 IMPLANT
IRRIGATION SUCT STRKRFLW 2 WTP (MISCELLANEOUS) ×2 IMPLANT
KIT BASIN OR (CUSTOM PROCEDURE TRAY) ×2 IMPLANT
KIT TURNOVER KIT B (KITS) ×2 IMPLANT
NS IRRIG 1000ML POUR BTL (IV SOLUTION) ×2 IMPLANT
PAD ARMBOARD 7.5X6 YLW CONV (MISCELLANEOUS) ×2 IMPLANT
POUCH RETRIEVAL ECOSAC 10 (ENDOMECHANICALS) IMPLANT
SCISSORS LAP 5X35 DISP (ENDOMECHANICALS) ×2 IMPLANT
SET TUBE SMOKE EVAC HIGH FLOW (TUBING) ×2 IMPLANT
SLEEVE Z-THREAD 5X100MM (TROCAR) ×2 IMPLANT
SPECIMEN JAR SMALL (MISCELLANEOUS) ×2 IMPLANT
STRIP CLOSURE SKIN 1/2X4 (GAUZE/BANDAGES/DRESSINGS) ×2 IMPLANT
SUT MNCRL AB 4-0 PS2 18 (SUTURE) ×2 IMPLANT
SUT VICRYL 0 UR6 27IN ABS (SUTURE) IMPLANT
TOWEL GREEN STERILE FF (TOWEL DISPOSABLE) ×2 IMPLANT
TRAY LAPAROSCOPIC MC (CUSTOM PROCEDURE TRAY) ×2 IMPLANT
TROCAR 11X100 Z THREAD (TROCAR) ×2 IMPLANT
TROCAR BALLN 12MMX100 BLUNT (TROCAR) ×2 IMPLANT
TROCAR XCEL NON-BLD 5MMX100MML (ENDOMECHANICALS) IMPLANT
TROCAR Z-THREAD OPTICAL 5X100M (TROCAR) ×2 IMPLANT
WARMER LAPAROSCOPE (MISCELLANEOUS) ×2 IMPLANT
WATER STERILE IRR 1000ML POUR (IV SOLUTION) ×2 IMPLANT

## 2023-08-16 NOTE — Interval H&P Note (Signed)
 History and Physical Interval Note:  08/16/2023 7:01 AM  [name]  has presented today for surgery, with the diagnosis of acute choleystitis / choledocholithiasis.  The various methods of treatment have been discussed with the patient and family. After consideration of risks, benefits and other options for treatment, the patient has consented to  Procedure(s): LAPAROSCOPIC CHOLECYSTECTOMY WITH  POSSIBLE INTRAOPERATIVE CHOLANGIOGRAM (N/A) as a surgical intervention.  The patient's history has been reviewed, patient examined, no change in status, stable for surgery.  I have reviewed the patient's chart and labs.  Questions were answered to the patient's satisfaction.     Wynona Luna

## 2023-08-16 NOTE — Plan of Care (Signed)

## 2023-08-16 NOTE — Op Note (Signed)
 Laparoscopic Cholecystectomy/ laparoscopic lysis of adhesions Procedure Note  Indications: This patient presents with acute cholecystitis and choledocholithiasis.  He underwent ERCP yesterday.  He continues to have right upper quadrant abdominal pain.  We recommended laparoscopic cholecystectomy.  Pre-operative Diagnosis: Calculus of gallbladder with acute cholecystitis, without mention of obstruction  Post-operative Diagnosis: Same  Surgeon: Wynona Luna   Assistants: none  Anesthesia: General endotracheal anesthesia  ASA Class: 2  Procedure Details  The patient was seen again in the Holding Room. The risks, benefits, complications, treatment options, and expected outcomes were discussed with the patient. The possibilities of reaction to medication, pulmonary aspiration, perforation of viscus, bleeding, recurrent infection, finding a normal gallbladder, the need for additional procedures, failure to diagnose a condition, the possible need to convert to an open procedure, and creating a complication requiring transfusion or operation were discussed with the patient. The likelihood of improving the patient's symptoms with return to their baseline status is good.  The patient and/or family concurred with the proposed plan, giving informed consent. The site of surgery properly noted. The patient was taken to Operating Room, identified as [name] and the procedure verified as Laparoscopic Cholecystectomy with Intraoperative Cholangiogram. A Time Out was held and the above information confirmed.  Prior to the induction of general anesthesia, antibiotic prophylaxis was administered. General endotracheal anesthesia was then administered and tolerated well. After the induction, the abdomen was prepped with Chloraprep and draped in sterile fashion. The patient was positioned in the supine position.  The patient has had a previous colon surgery with a lower midline incision.  Therefore, I  made the decision to begin with Optiview approach.  Local anesthetic agent was injected into the skin in the epigastrium to the left of midline.  A 5 mm incision was made.  The Optiview trocar was used to cannulate the peritoneal cavity under direct vision.  We insufflated CO2 maintaining a maximum pressure of 15 mmHg.  The patient has significant omental adhesions and small bowel adhesions to the anterior abdominal wall near the umbilicus.  I inserted a 5 mm port in the left abdomen at the level of the umbilicus.  We are able to use cautery scissors to take down the adhesions to clear the abdominal wall down to the level of the umbilicus.  The patient has further adhesions below the umbilicus but just leave those alone.  We made a transverse incision 2 cm above the umbilicus.  We dissected down to the abdominal fascia with blunt dissection.  The fascia was incised vertically and we entered the peritoneal cavity bluntly.  A pursestring suture of 0-Vicryl was placed around the fascial opening.  The Hasson cannula was inserted and secured with the stay suture.  Pneumoperitoneum was then created with CO2 and tolerated well without any adverse changes in the patient's vital signs. An 11-mm port was placed in the epigastric position to replace the Optiview trocar.  Two 5-mm ports were placed in the right upper quadrant. All skin incisions were infiltrated with a local anesthetic agent before making the incision and placing the trocars.   We positioned the patient in reverse Trendelenburg, tilted slightly to the patient's left.  The gallbladder was identified.  It was quite thickened with a lot of adherent omentum and duodenum.  We used blunt dissection and judicious cautery to take down these adhesions.  We bluntly dissected the duodenum away to expose the hilum of the gallbladder.  The fundus was grasped and retracted cephalad. Adhesions were  lysed bluntly and with the electrocautery where indicated, taking care not  to injure any adjacent organs or viscus. The infundibulum was grasped and retracted laterally, exposing the peritoneum overlying the triangle of Calot. This was then divided and exposed in a blunt fashion. The cystic duct was clearly identified and bluntly dissected circumferentially. A critical view of the cystic duct and cystic artery was obtained.  The cystic duct is dilated, as would be expected with his choledocholithiasis.  The cystic duct was then ligated with clips and divided.  The clips did not seem to reach completely across the cystic duct, so I ligated it with a Endoloop.  The cystic artery was, dissected free, ligated with clips and divided as well.   The gallbladder was dissected from the liver bed in retrograde fashion with the electrocautery. The gallbladder was removed and placed in an Eco sac. The liver bed was irrigated and inspected.  The patient has several large veins in the gallbladder fossa.  Hemostasis was achieved with the high energy electrocautery. Copious irrigation was utilized and was repeatedly aspirated until clear.  We packed Surgicel snow into the gallbladder fossa.  2 of the Eco sacs were required due to the large size of the gallbladder.  The gallbladder and Eco sac were then removed through the umbilical port site.   We had to enlarge the fascial opening and extended the incision superiorly to allow removal of the very large gallbladder.  The pursestring suture was used to close the umbilical fascia.  Additional 0 Vicryl sutures were used to complete the fascial closure.  We again inspected the right upper quadrant for hemostasis.  Pneumoperitoneum was released as we removed the trocars.  4-0 Monocryl was used to close the skin.   Benzoin, steri-strips, and clean dressings were applied. The patient was then extubated and brought to the recovery room in stable condition. Instrument, sponge, and needle counts were correct at closure and at the conclusion of the case.    Findings: Acute cholecystitis with cholelithiasis  Estimated Blood Loss: less than 50 mL         Drains: none         Specimens: Gallbladder           Complications: None; patient tolerated the procedure well.         Disposition: PACU - hemodynamically stable.         Condition: stable  Wilmon Arms. Corliss Skains, MD, [facility]  General Surgery   08/16/2023 10:00 AM

## 2023-08-16 NOTE — Anesthesia Postprocedure Evaluation (Signed)
 Anesthesia Post Note  Patient: [name]  Procedure(s) Performed: LAPAROSCOPIC CHOLECYSTECTOMY LYSIS OF ADHESION (Abdomen)     Patient location during evaluation: PACU Anesthesia Type: General Level of consciousness: awake and alert Pain management: pain level controlled Vital Signs Assessment: post-procedure vital signs reviewed and stable Respiratory status: spontaneous breathing, nonlabored ventilation, respiratory function stable and patient connected to nasal cannula oxygen Cardiovascular status: blood pressure returned to baseline and stable Postop Assessment: no apparent nausea or vomiting Anesthetic complications: no  No notable events documented.  Last Vitals:  Vitals:   08/16/23 1015 08/16/23 1024  BP: (!) 143/78 139/79  Pulse: 95 91  Resp: 19 19  Temp:  36.7 C  SpO2: 90% 92%    Last Pain:  Vitals:   08/16/23 1024  TempSrc:   PainSc: 0-No pain                 Kennieth Rad

## 2023-08-16 NOTE — Progress Notes (Signed)
 PROGRESS NOTE    [name]  ZOX:[id number] DOB: 1956-02-14 DOA: 08/13/2023 PCP: [facility]    Brief Narrative:  68 year old with history of rectal adenocarcinoma treated with excision and chemo, insulin-dependent type 2 diabetes, chronic hepatitis C infection, GERD and hyperlipidemia presented with multiple episodes of vomiting and epigastric pain. He was tachycardic, temperature 101.7 with a stable blood pressures in the ER. On room air. CT scan consistent with cholelithiasis and cholecystitis. Bilirubin 5. MRCP positive for choledocholithiasis. Started on broad-spectrum antibiotics after drawing blood cultures. GI and surgery consulted.  Underwent ERCP 2/28 Underwent lap chole 3/1  Subjective: Came back from procedure.  "I feel fine, the mucosa in my belly". No other overnight events.  Assessment & Plan:   Acute calculus cholecystitis with choledocholithiasis: Klebsiella pneumonia bacteremia Sepsis present on admission secondary to cholecystitis, Klebsiella bacteremia. ERCP with a sphincterotomy 2/28 Lap chole 3/1 Continue antibiotics today.  Since source control already achieved, can treat with oral antibiotics for 7 days on discharge.  Hypokalemia/hypomagnesemia: Replaced.  Improved.  Will recheck tomorrow.  Type 2 diabetes: On insulin at home.  Keep on sliding scale insulin.  AKI on CKD stage IIIa: Baseline creatinine about 1.39.  He was treated with IV fluids and trending down.  Continue maintenance IV fluids. Repeat labs tomorrow.  Neuropathic pain: On Robaxin and gabapentin.  Continue.    DVT prophylaxis: SCDs Start: 08/14/23 0417 Place TED hose Start: 08/14/23 0417   Code Status: Full code Family Communication: None at the bedside. Disposition Plan: Status is: Inpatient Remains inpatient appropriate because: Immediate postop     Consultants:  GI Surgery  Procedures:  ERCP  Lap chole  Antimicrobials:  Flagyl 2/26---  2/28 Rocephin 2/26---     Objective: Vitals:   08/16/23 0954 08/16/23 1000 08/16/23 1015 08/16/23 1024  BP: (!) 150/79 (!) 145/76 (!) 143/78 139/79  Pulse: 97 97 95 91  Resp: (!) 22 20 19 19   Temp: 98.7 F (37.1 C)   98.1 F (36.7 C)  TempSrc:      SpO2: 93% 93% 90% 92%  Weight:      Height:        Intake/Output Summary (Last 24 hours) at 08/16/2023 1310 Last data filed at 08/16/2023 8119 Gross per 24 hour  Intake 1360 ml  Output 750 ml  Net 610 ml   Filed Weights   08/13/23 1852 08/16/23 0713  Weight: 100.7 kg 100.7 kg    Examination: General: Looks fairly comfortable.  Eating popsicle. Cardiovascular: S1-S2 normal.  Regular rate rhythm. Respiratory: Breath clear.  No added sounds. Gastrointestinal: Soft.  Postop surgical ports mildly tender mildly distended.  Bowel sound present. Ext: No swelling or edema.  No cyanosis.     Data Reviewed: I have personally reviewed following labs and imaging studies  CBC: Recent Labs  Lab 08/13/23 1856 08/14/23 0222 08/14/23 0550 08/15/23 0752 08/16/23 1201  WBC 1.4* 4.0 5.6 3.9* 3.0*  NEUTROABS  --  3.9  --   --   --   HGB 10.7* 9.4* 9.9* 8.9* 8.9*  HCT 30.5* 26.9* 28.1* 24.8* 24.7*  MCV 105.9* 105.5* 105.2* 103.3* 103.8*  PLT 189 164 167 127* 130*   Basic Metabolic Panel: Recent Labs  Lab 08/13/23 1856 08/14/23 0550 08/15/23 0752 08/16/23 1201  NA 135 133* 138 135  K 3.2* 4.0 3.3* 3.9  CL 102 100 105 106  CO2 19* 18* 17* 20*  GLUCOSE 201* 228* 113* 175*  BUN 18 21 22  23  CREATININE 1.39* 1.92* 1.57* 1.50*  CALCIUM 8.5* 8.2* 8.3* 8.0*  MG  --   --  1.5* 2.0  PHOS  --   --  2.7  --    GFR: Estimated Creatinine Clearance: 59.6 mL/min (A) (by C-G formula based on SCr of 1.5 mg/dL (H)). Liver Function Tests: Recent Labs  Lab 08/14/23 0222 08/14/23 0550 08/15/23 0752 08/16/23 1201  AST 117* 107* 41 72*  ALT 36 38 24 24  ALKPHOS 93 92 67 128*  BILITOT 8.8* 8.6* 5.7* 4.7*  PROT 6.3* 6.5 5.9* 5.9*   ALBUMIN 3.2* 3.2* 2.6* 2.5*   Recent Labs  Lab 08/13/23 2144  LIPASE 29   No results for input(s): "AMMONIA" in the last 168 hours. Coagulation Profile: Recent Labs  Lab 08/13/23 2144  INR 1.2   Cardiac Enzymes: No results for input(s): "CKTOTAL", "CKMB", "CKMBINDEX", "TROPONINI" in the last 168 hours. BNP (last 3 results) No results for input(s): "PROBNP" in the last 8760 hours. HbA1C: No results for input(s): "HGBA1C" in the last 72 hours. CBG: Recent Labs  Lab 08/16/23 0518 08/16/23 0710 08/16/23 0902 08/16/23 0958 08/16/23 1138  GLUCAP 103* 132* 146* 171* 166*   Lipid Profile: No results for input(s): "CHOL", "HDL", "LDLCALC", "TRIG", "CHOLHDL", "LDLDIRECT" in the last 72 hours. Thyroid Function Tests: No results for input(s): "TSH", "T4TOTAL", "FREET4", "T3FREE", "THYROIDAB" in the last 72 hours. Anemia Panel: No results for input(s): "VITAMINB12", "FOLATE", "FERRITIN", "TIBC", "IRON", "RETICCTPCT" in the last 72 hours. Sepsis Labs: Recent Labs  Lab 08/13/23 1907 08/13/23 2216  LATICACIDVEN 2.6* 1.5    Recent Results (from the past 240 hours)  Resp panel by RT-PCR (RSV, Flu A&B, Covid) Anterior Nasal Swab     Status: None   Collection Time: 08/13/23  9:44 PM   Specimen: Anterior Nasal Swab  Result Value Ref Range Status   SARS Coronavirus 2 by RT PCR NEGATIVE NEGATIVE Final   Influenza A by PCR NEGATIVE NEGATIVE Final   Influenza B by PCR NEGATIVE NEGATIVE Final    Comment: (NOTE) The Xpert Xpress SARS-CoV-2/FLU/RSV plus assay is intended as an aid in the diagnosis of influenza from Nasopharyngeal swab specimens and should not be used as a sole basis for treatment. Nasal washings and aspirates are unacceptable for Xpert Xpress SARS-CoV-2/FLU/RSV testing.  Fact Sheet for Patients: BloggerCourse.com  Fact Sheet for Healthcare Providers: SeriousBroker.it  This test is not yet approved or cleared by  the [place] FDA and has been authorized for detection and/or diagnosis of SARS-CoV-2 by FDA under an Emergency Use Authorization (EUA). This EUA will remain in effect (meaning this test can be used) for the duration of the COVID-19 declaration under Section 564(b)(1) of the Act, 21 U.S.C. section 360bbb-3(b)(1), unless the authorization is terminated or revoked.     Resp Syncytial Virus by PCR NEGATIVE NEGATIVE Final    Comment: (NOTE) Fact Sheet for Patients: BloggerCourse.com  Fact Sheet for Healthcare Providers: SeriousBroker.it  This test is not yet approved or cleared by the [place] FDA and has been authorized for detection and/or diagnosis of SARS-CoV-2 by FDA under an Emergency Use Authorization (EUA). This EUA will remain in effect (meaning this test can be used) for the duration of the COVID-19 declaration under Section 564(b)(1) of the Act, 21 U.S.C. section 360bbb-3(b)(1), unless the authorization is terminated or revoked.  Performed at [facility], [address] [place], [place] [address]   Blood Culture (routine x 2)     Status: Abnormal (Preliminary  result)   Collection Time: 08/13/23  9:44 PM   Specimen: BLOOD LEFT ARM  Result Value Ref Range Status   Specimen Description BLOOD LEFT ARM  Final   Special Requests   Final    BOTTLES DRAWN AEROBIC AND ANAEROBIC Blood Culture results may not be optimal due to an inadequate volume of blood received in culture bottles   Culture  Setup Time   Final    GRAM NEGATIVE RODS IN BOTH AEROBIC AND ANAEROBIC BOTTLES CRITICAL RESULT CALLED TO, READ BACK BY AND VERIFIED WITH: PHARMD JENNY ON [id number] @1158  BY SM Performed at [facility], [address] [place], [place] [address]    Culture KLEBSIELLA PNEUMONIAE (A)  Final   Report Status PENDING  Incomplete   Organism ID, Bacteria KLEBSIELLA PNEUMONIAE  Final   Organism ID, Bacteria KLEBSIELLA  PNEUMONIAE  Final      Susceptibility   Klebsiella pneumoniae - KIRBY BAUER*    CEFAZOLIN RESISTANT Resistant    Klebsiella pneumoniae - MIC*    AMPICILLIN >=32 RESISTANT Resistant     CEFEPIME <=0.12 SENSITIVE Sensitive     CEFTAZIDIME <=1 SENSITIVE Sensitive     CEFTRIAXONE <=0.25 SENSITIVE Sensitive     CIPROFLOXACIN <=0.25 SENSITIVE Sensitive     GENTAMICIN <=1 SENSITIVE Sensitive     IMIPENEM <=0.25 SENSITIVE Sensitive     TRIMETH/SULFA <=20 SENSITIVE Sensitive     AMPICILLIN/SULBACTAM >=32 RESISTANT Resistant     PIP/TAZO <=4 SENSITIVE Sensitive ug/mL    * KLEBSIELLA PNEUMONIAE    KLEBSIELLA PNEUMONIAE  Blood Culture (routine x 2)     Status: Abnormal (Preliminary result)   Collection Time: 08/13/23  9:44 PM   Specimen: BLOOD LEFT ARM  Result Value Ref Range Status   Specimen Description BLOOD LEFT ARM  Final   Special Requests   Final    BOTTLES DRAWN AEROBIC AND ANAEROBIC Blood Culture adequate volume   Culture  Setup Time   Final    GRAM NEGATIVE RODS AEROBIC BOTTLE ONLY CRITICAL VALUE NOTED.  VALUE IS CONSISTENT WITH PREVIOUSLY REPORTED AND CALLED VALUE.    Culture (A)  Final    KLEBSIELLA PNEUMONIAE SUSCEPTIBILITIES PERFORMED ON PREVIOUS CULTURE WITHIN THE LAST 5 DAYS. Performed at [facility], [address], [place], [place] [address]    Report Status PENDING  Incomplete  Blood Culture ID Panel (Reflexed)     Status: Abnormal   Collection Time: 08/13/23  9:44 PM  Result Value Ref Range Status   Enterococcus faecalis NOT DETECTED NOT DETECTED Final   Enterococcus Faecium NOT DETECTED NOT DETECTED Final   Listeria monocytogenes NOT DETECTED NOT DETECTED Final   Staphylococcus species NOT DETECTED NOT DETECTED Final   Staphylococcus aureus (BCID) NOT DETECTED NOT DETECTED Final   Staphylococcus epidermidis NOT DETECTED NOT DETECTED Final   Staphylococcus lugdunensis NOT DETECTED NOT DETECTED Final   Streptococcus species NOT DETECTED NOT DETECTED Final    Streptococcus agalactiae NOT DETECTED NOT DETECTED Final   Streptococcus pneumoniae NOT DETECTED NOT DETECTED Final   Streptococcus pyogenes NOT DETECTED NOT DETECTED Final   A.calcoaceticus-baumannii NOT DETECTED NOT DETECTED Final   Bacteroides fragilis NOT DETECTED NOT DETECTED Final   Enterobacterales DETECTED (A) NOT DETECTED Final    Comment: Enterobacterales represent a large order of gram negative bacteria, not a single organism. CRITICAL RESULT CALLED TO, READ BACK BY AND VERIFIED WITH: PHARMD JENNY Z ON [id number] @1158  BY SM    Enterobacter cloacae complex NOT DETECTED NOT DETECTED Final  Escherichia coli NOT DETECTED NOT DETECTED Final   Klebsiella aerogenes NOT DETECTED NOT DETECTED Final   Klebsiella oxytoca NOT DETECTED NOT DETECTED Final   Klebsiella pneumoniae DETECTED (A) NOT DETECTED Final    Comment: CRITICAL RESULT CALLED TO, READ BACK BY AND VERIFIED WITH: PHARMD JENNY Z ON [id number] @1158  BY SM    Proteus species NOT DETECTED NOT DETECTED Final   Salmonella species NOT DETECTED NOT DETECTED Final   Serratia marcescens NOT DETECTED NOT DETECTED Final   Haemophilus influenzae NOT DETECTED NOT DETECTED Final   Neisseria meningitidis NOT DETECTED NOT DETECTED Final   Pseudomonas aeruginosa NOT DETECTED NOT DETECTED Final   Stenotrophomonas maltophilia NOT DETECTED NOT DETECTED Final   Candida albicans NOT DETECTED NOT DETECTED Final   Candida auris NOT DETECTED NOT DETECTED Final   Candida glabrata NOT DETECTED NOT DETECTED Final   Candida krusei NOT DETECTED NOT DETECTED Final   Candida parapsilosis NOT DETECTED NOT DETECTED Final   Candida tropicalis NOT DETECTED NOT DETECTED Final   Cryptococcus neoformans/gattii NOT DETECTED NOT DETECTED Final   CTX-M ESBL NOT DETECTED NOT DETECTED Final   Carbapenem resistance IMP NOT DETECTED NOT DETECTED Final   Carbapenem resistance KPC NOT DETECTED NOT DETECTED Final   Carbapenem resistance NDM NOT DETECTED NOT DETECTED Final    Carbapenem resist OXA 48 LIKE NOT DETECTED NOT DETECTED Final   Carbapenem resistance VIM NOT DETECTED NOT DETECTED Final    Comment: Performed at [facility], [address] [place], [place] [address]         Radiology Studies: DG ERCP Result Date: 08/16/2023 CLINICAL DATA:  Elective surgery.  ERCP.  Choledocholithiasis. EXAM: ERCP TECHNIQUE: Multiple spot images obtained with the fluoroscopic device and submitted for interpretation post-procedure. FLUOROSCOPY: Radiation Exposure Index (as provided by the fluoroscopic device): 68 mGy Kerma COMPARISON:  CT chest abdomen pelvis 08/14/2023 FINDINGS: Eighteen intraoperative fluoroscopic images were provided for interpretation. The submitted images demonstrate cannulation and opacification of the intra and extrahepatic bile ducts. The common bile duct appears mildly dilated. Multiple balloon sweeps are seen. No discrete filling defects are identified on the final images of the common bile duct. IMPRESSION: Intraoperative cholangiogram demonstrating multiple balloon sweeps of the common bile duct. These images were submitted for radiologic interpretation only. Please see the procedural report for the amount of contrast and the fluoroscopy time utilized. Electronically Signed   By: Acquanetta Belling M.D.   On: 08/16/2023 07:36        Scheduled Meds:  diclofenac  100 mg Rectal Once   escitalopram  20 mg Oral Daily   insulin aspart  0-9 Units Subcutaneous Q4H   metoprolol tartrate  25 mg Oral BID   mirtazapine  15 mg Oral QHS   sodium chloride flush  3 mL Intravenous Q12H   Continuous Infusions:  cefTRIAXone (ROCEPHIN)  IV 2 g (08/15/23 0906)     LOS: 2 days    Time spent: 35 minutes    Dorcas Carrow, MD [facility]

## 2023-08-16 NOTE — Anesthesia Procedure Notes (Signed)
 Procedure Name: Intubation Date/Time: 08/16/2023 7:45 AM  Performed by: Wilder Glade, CRNAPre-anesthesia Checklist: Patient identified, Emergency Drugs available, Suction available, Patient being monitored and Timeout performed Patient Re-evaluated:Patient Re-evaluated prior to induction Oxygen Delivery Method: Circle system utilized Preoxygenation: Pre-oxygenation with 100% oxygen Induction Type: IV induction Ventilation: Mask ventilation without difficulty Laryngoscope Size: Mac, 3 and Glidescope Grade View: Grade I Tube type: Oral Tube size: 7.5 mm Number of attempts: 1 Airway Equipment and Method: Stylet and Video-laryngoscopy Placement Confirmation: ETT inserted through vocal cords under direct vision, positive ETCO2 and breath sounds checked- equal and bilateral Secured at: 22 cm Tube secured with: Tape Dental Injury: Teeth and Oropharynx as per pre-operative assessment  Comments: Brief smooth atraumatic dentition unchanged

## 2023-08-16 NOTE — Transfer of Care (Signed)
 Immediate Anesthesia Transfer of Care Note  Patient: [name]  Procedure(s) Performed: LAPAROSCOPIC CHOLECYSTECTOMY LYSIS OF ADHESION (Abdomen)  Patient Location: PACU  Anesthesia Type:General  Level of Consciousness: awake, alert , oriented, and patient cooperative  Airway & Oxygen Therapy: Patient Spontanous Breathing and Patient connected to face mask oxygen  Post-op Assessment: Report given to RN and Post -op Vital signs reviewed and stable  Post vital signs: Reviewed and stable  Last Vitals:  Vitals Value Taken Time  BP 150/79 08/16/23 0954  Temp    Pulse 97 08/16/23 0958  Resp 24 08/16/23 0958  SpO2 93 % 08/16/23 0958  Vitals shown include unfiled device data.  Last Pain:  Vitals:   08/16/23 0656  TempSrc: Oral  PainSc:          Complications: No notable events documented.

## 2023-08-16 NOTE — Progress Notes (Signed)
 PACU made aware of pt current temp of 101.45F. Pt has NPO orders. PACU RN states will give Tylenol when pt comes down.

## 2023-08-16 NOTE — Anesthesia Preprocedure Evaluation (Signed)
 Anesthesia Evaluation  Patient identified by MRN, date of birth, ID band Patient awake    Reviewed: Allergy & Precautions, NPO status , Patient's Chart, lab work & pertinent test results  Airway Mallampati: II  TM Distance: >3 FB Neck ROM: Full    Dental  (+) Dental Advisory Given   Pulmonary neg pulmonary ROS   breath sounds clear to auscultation       Cardiovascular hypertension, Pt. on medications and Pt. on home beta blockers  Rhythm:Regular Rate:Normal     Neuro/Psych negative neurological ROS     GI/Hepatic ,GERD  ,,(+) Hepatitis -, C  Endo/Other  diabetes, Type 2, Insulin Dependent    Renal/GU Renal InsufficiencyRenal disease     Musculoskeletal   Abdominal   Peds  Hematology  (+) Blood dyscrasia, anemia   Anesthesia Other Findings   Reproductive/Obstetrics                             Anesthesia Physical Anesthesia Plan  ASA: 2  Anesthesia Plan: General   Post-op Pain Management: Tylenol PO (pre-op)*   Induction: Intravenous  PONV Risk Score and Plan: 2 and Dexamethasone, Ondansetron and Treatment may vary due to age or medical condition  Airway Management Planned: Oral ETT  Additional Equipment:   Intra-op Plan:   Post-operative Plan: Extubation in OR  Informed Consent: I have reviewed the patients History and Physical, chart, labs and discussed the procedure including the risks, benefits and alternatives for the proposed anesthesia with the patient or authorized representative who has indicated his/her understanding and acceptance.     Dental advisory given  Plan Discussed with: CRNA  Anesthesia Plan Comments:        Anesthesia Quick Evaluation

## 2023-08-17 ENCOUNTER — Encounter (HOSPITAL_COMMUNITY): Payer: Self-pay | Admitting: Surgery

## 2023-08-17 DIAGNOSIS — K8042 Calculus of bile duct with acute cholecystitis without obstruction: Secondary | ICD-10-CM | POA: Diagnosis not present

## 2023-08-17 LAB — COMPREHENSIVE METABOLIC PANEL
ALT: 31 U/L (ref 0–44)
AST: 98 U/L — ABNORMAL HIGH (ref 15–41)
Albumin: 2.6 g/dL — ABNORMAL LOW (ref 3.5–5.0)
Alkaline Phosphatase: 178 U/L — ABNORMAL HIGH (ref 38–126)
Anion gap: 13 (ref 5–15)
BUN: 18 mg/dL (ref 8–23)
CO2: 20 mmol/L — ABNORMAL LOW (ref 22–32)
Calcium: 8.7 mg/dL — ABNORMAL LOW (ref 8.9–10.3)
Chloride: 101 mmol/L (ref 98–111)
Creatinine, Ser: 1.45 mg/dL — ABNORMAL HIGH (ref 0.61–1.24)
GFR, Estimated: 53 mL/min — ABNORMAL LOW (ref >60)
Glucose, Bld: 132 mg/dL — ABNORMAL HIGH (ref 70–99)
Potassium: 3.7 mmol/L (ref 3.5–5.1)
Sodium: 134 mmol/L — ABNORMAL LOW (ref 135–145)
Total Bilirubin: 3.1 mg/dL — ABNORMAL HIGH (ref 0.0–1.2)
Total Protein: 5.8 g/dL — ABNORMAL LOW (ref 6.5–8.1)

## 2023-08-17 LAB — CBC
HCT: 25.2 % — ABNORMAL LOW (ref 39.0–52.0)
Hemoglobin: 8.8 g/dL — ABNORMAL LOW (ref 13.0–17.0)
MCH: 36.2 pg — ABNORMAL HIGH (ref 26.0–34.0)
MCHC: 34.9 g/dL (ref 30.0–36.0)
MCV: 103.7 fL — ABNORMAL HIGH (ref 80.0–100.0)
Platelets: 147 10*3/uL — ABNORMAL LOW (ref 150–400)
RBC: 2.43 MIL/uL — ABNORMAL LOW (ref 4.22–5.81)
RDW: 17.4 % — ABNORMAL HIGH (ref 11.5–15.5)
WBC: 3.9 10*3/uL — ABNORMAL LOW (ref 4.0–10.5)
nRBC: 0.5 % — ABNORMAL HIGH (ref 0.0–0.2)

## 2023-08-17 LAB — GLUCOSE, CAPILLARY
Glucose-Capillary: 153 mg/dL — ABNORMAL HIGH (ref 70–99)
Glucose-Capillary: 116 mg/dL — ABNORMAL HIGH (ref 70–99)
Glucose-Capillary: 165 mg/dL — ABNORMAL HIGH (ref 70–99)
Glucose-Capillary: 162 mg/dL — ABNORMAL HIGH (ref 70–99)
Glucose-Capillary: 188 mg/dL — ABNORMAL HIGH (ref 70–99)

## 2023-08-17 MED ORDER — ACETAMINOPHEN 325 MG PO TABS: 650.0000 mg | ORAL_TABLET | Freq: Four times a day (QID) | ORAL | Status: AC | PRN

## 2023-08-17 MED ORDER — OXYCODONE HCL 5 MG PO TABS: 5.0000 mg | ORAL_TABLET | Freq: Four times a day (QID) | ORAL | 0 refills | Status: AC | PRN

## 2023-08-17 MED ORDER — CEPHALEXIN 500 MG PO CAPS: 500.0000 mg | ORAL_CAPSULE | Freq: Three times a day (TID) | ORAL | Status: DC

## 2023-08-17 MED ORDER — ENOXAPARIN SODIUM 40 MG/0.4ML IJ SOSY
40.0000 mg | PREFILLED_SYRINGE | INTRAMUSCULAR | Status: DC
  Administered 2023-08-17: 40 mg via SUBCUTANEOUS
  Filled 2023-08-17: qty 0.4

## 2023-08-17 MED ORDER — LEVOFLOXACIN 750 MG PO TABS
750.0000 mg | ORAL_TABLET | Freq: Every day | ORAL | Status: DC
  Administered 2023-08-18: 750 mg via ORAL
  Filled 2023-08-17: qty 1

## 2023-08-17 NOTE — Progress Notes (Signed)
 PROGRESS NOTE    [name]  OZH:[id number] DOB: July 18, 1955 DOA: 08/13/2023 PCP: [facility]    Brief Narrative:  68 year old with history of rectal adenocarcinoma treated with excision and chemo, insulin-dependent type 2 diabetes, chronic hepatitis C infection, GERD and hyperlipidemia presented with multiple episodes of vomiting and epigastric pain. He was tachycardic, temperature 101.7 with a stable blood pressures in the ER. On room air. CT scan consistent with cholelithiasis and cholecystitis. Bilirubin 5. MRCP positive for choledocholithiasis. Started on broad-spectrum antibiotics after drawing blood cultures. GI and surgery consulted.  Underwent ERCP 2/28 Underwent lap chole 3/1  Subjective:  Patient seen and examined.  Moderately sore abdomen.  He had a rough night with too much disturbance.  Denies any nausea.  He has not mobilized around.  Feels nervous about going home.  Assessment & Plan:   Acute calculus cholecystitis with choledocholithiasis: Klebsiella pneumonia bacteremia Sepsis present on admission secondary to cholecystitis, Klebsiella bacteremia. ERCP with a sphincterotomy 2/28 Lap chole 3/1 Advancing diet.  Mobilize in the hallway. Source control achieved, will change to oral antibiotics to complete 7 days of therapy. LFTs trending down.  Hypokalemia/hypomagnesemia: Replaced.  Improved.   Type 2 diabetes: On insulin at home.  Keep on sliding scale insulin.  AKI on CKD stage IIIa: Baseline creatinine about 1.39.  He was treated with IV fluids and trending down.    Neuropathic pain: On Robaxin and gabapentin.  Continue.  MedSurg bed.  Mobilize out of bed in the hallway.  Advance diet.  Anticipate home tomorrow.   DVT prophylaxis: SCDs Start: 08/14/23 0417 Place TED hose Start: 08/14/23 0417   Code Status: Full code Family Communication: None at the bedside. Disposition Plan: Status is: Inpatient Remains inpatient appropriate because:  Immediate postop     Consultants:  GI Surgery  Procedures:  ERCP  Lap chole  Antimicrobials:  Flagyl 2/26--- 2/28 Rocephin 2/26---     Objective: Vitals:   08/16/23 2102 08/16/23 2257 08/17/23 0433 08/17/23 0741  BP: (!) 147/76  (!) 160/90 (!) 150/86  Pulse: 84  78 71  Resp: 20  18 18   Temp:  (!) 97.4 F (36.3 C)  98.2 F (36.8 C)  TempSrc:  Oral  Oral  SpO2: 97%  99% 100%  Weight:      Height:        Intake/Output Summary (Last 24 hours) at 08/17/2023 1113 Last data filed at 08/16/2023 2103 Gross per 24 hour  Intake --  Output 300 ml  Net -300 ml   Filed Weights   08/13/23 1852 08/16/23 0713  Weight: 100.7 kg 100.7 kg    Examination: General: Slightly anxious.  Otherwise not in distress. Cardiovascular: S1-S2 normal.  Regular rate rhythm. Respiratory: Breath clear.  No added sounds. Gastrointestinal: Soft.  Postop surgical ports mildly tender mildly distended.  Bowel sound present. Ext: No swelling or edema.  No cyanosis.     Data Reviewed: I have personally reviewed following labs and imaging studies  CBC: Recent Labs  Lab 08/14/23 0222 08/14/23 0550 08/15/23 0752 08/16/23 1201 08/17/23 0716  WBC 4.0 5.6 3.9* 3.0* 3.9*  NEUTROABS 3.9  --   --   --   --   HGB 9.4* 9.9* 8.9* 8.9* 8.8*  HCT 26.9* 28.1* 24.8* 24.7* 25.2*  MCV 105.5* 105.2* 103.3* 103.8* 103.7*  PLT 164 167 127* 130* 147*   Basic Metabolic Panel: Recent Labs  Lab 08/13/23 1856 08/14/23 0550 08/15/23 0752 08/16/23 1201 08/17/23 0716  NA 135 133*  138 135 134*  K 3.2* 4.0 3.3* 3.9 3.7  CL 102 100 105 106 101  CO2 19* 18* 17* 20* 20*  GLUCOSE 201* 228* 113* 175* 132*  BUN 18 21 22 23 18   CREATININE 1.39* 1.92* 1.57* 1.50* 1.45*  CALCIUM 8.5* 8.2* 8.3* 8.0* 8.7*  MG  --   --  1.5* 2.0  --   PHOS  --   --  2.7  --   --    GFR: Estimated Creatinine Clearance: 61.7 mL/min (A) (by C-G formula based on SCr of 1.45 mg/dL (H)). Liver Function Tests: Recent Labs  Lab  08/14/23 0222 08/14/23 0550 08/15/23 0752 08/16/23 1201 08/17/23 0716  AST 117* 107* 41 72* 98*  ALT 36 38 24 24 31   ALKPHOS 93 92 67 128* 178*  BILITOT 8.8* 8.6* 5.7* 4.7* 3.1*  PROT 6.3* 6.5 5.9* 5.9* 5.8*  ALBUMIN 3.2* 3.2* 2.6* 2.5* 2.6*   Recent Labs  Lab 08/13/23 2144  LIPASE 29   No results for input(s): "AMMONIA" in the last 168 hours. Coagulation Profile: Recent Labs  Lab 08/13/23 2144  INR 1.2   Cardiac Enzymes: No results for input(s): "CKTOTAL", "CKMB", "CKMBINDEX", "TROPONINI" in the last 168 hours. BNP (last 3 results) No results for input(s): "PROBNP" in the last 8760 hours. HbA1C: No results for input(s): "HGBA1C" in the last 72 hours. CBG: Recent Labs  Lab 08/16/23 1138 08/16/23 1546 08/16/23 2052 08/17/23 0432 08/17/23 0906  GLUCAP 166* 267* 199* 153* 116*   Lipid Profile: No results for input(s): "CHOL", "HDL", "LDLCALC", "TRIG", "CHOLHDL", "LDLDIRECT" in the last 72 hours. Thyroid Function Tests: No results for input(s): "TSH", "T4TOTAL", "FREET4", "T3FREE", "THYROIDAB" in the last 72 hours. Anemia Panel: No results for input(s): "VITAMINB12", "FOLATE", "FERRITIN", "TIBC", "IRON", "RETICCTPCT" in the last 72 hours. Sepsis Labs: Recent Labs  Lab 08/13/23 1907 08/13/23 2216  LATICACIDVEN 2.6* 1.5    Recent Results (from the past 240 hours)  Resp panel by RT-PCR (RSV, Flu A&B, Covid) Anterior Nasal Swab     Status: None   Collection Time: 08/13/23  9:44 PM   Specimen: Anterior Nasal Swab  Result Value Ref Range Status   SARS Coronavirus 2 by RT PCR NEGATIVE NEGATIVE Final   Influenza A by PCR NEGATIVE NEGATIVE Final   Influenza B by PCR NEGATIVE NEGATIVE Final    Comment: (NOTE) The Xpert Xpress SARS-CoV-2/FLU/RSV plus assay is intended as an aid in the diagnosis of influenza from Nasopharyngeal swab specimens and should not be used as a sole basis for treatment. Nasal washings and aspirates are unacceptable for Xpert Xpress  SARS-CoV-2/FLU/RSV testing.  Fact Sheet for Patients: BloggerCourse.com  Fact Sheet for Healthcare Providers: SeriousBroker.it  This test is not yet approved or cleared by the [place] FDA and has been authorized for detection and/or diagnosis of SARS-CoV-2 by FDA under an Emergency Use Authorization (EUA). This EUA will remain in effect (meaning this test can be used) for the duration of the COVID-19 declaration under Section 564(b)(1) of the Act, 21 U.S.C. section 360bbb-3(b)(1), unless the authorization is terminated or revoked.     Resp Syncytial Virus by PCR NEGATIVE NEGATIVE Final    Comment: (NOTE) Fact Sheet for Patients: BloggerCourse.com  Fact Sheet for Healthcare Providers: SeriousBroker.it  This test is not yet approved or cleared by the [place] FDA and has been authorized for detection and/or diagnosis of SARS-CoV-2 by FDA under an Emergency Use Authorization (EUA). This EUA will remain in effect (meaning this test  can be used) for the duration of the COVID-19 declaration under Section 564(b)(1) of the Act, 21 U.S.C. section 360bbb-3(b)(1), unless the authorization is terminated or revoked.  Performed at [facility], [address], [place], [place] [address]   Blood Culture (routine x 2)     Status: Abnormal   Collection Time: 08/13/23  9:44 PM   Specimen: BLOOD LEFT ARM  Result Value Ref Range Status   Specimen Description BLOOD LEFT ARM  Final   Special Requests   Final    BOTTLES DRAWN AEROBIC AND ANAEROBIC Blood Culture results may not be optimal due to an inadequate volume of blood received in culture bottles   Culture  Setup Time   Final    GRAM NEGATIVE RODS IN BOTH AEROBIC AND ANAEROBIC BOTTLES CRITICAL RESULT CALLED TO, READ BACK BY AND VERIFIED WITH: PHARMD JENNY ON [id number] @1158  BY SM Performed at [facility], [address] [place], [place] [address]    Culture KLEBSIELLA PNEUMONIAE (A)  Final   Report Status 08/16/2023 FINAL  Final   Organism ID, Bacteria KLEBSIELLA PNEUMONIAE  Final   Organism ID, Bacteria KLEBSIELLA PNEUMONIAE  Final      Susceptibility   Klebsiella pneumoniae - KIRBY BAUER*    CEFAZOLIN RESISTANT Resistant    Klebsiella pneumoniae - MIC*    AMPICILLIN >=32 RESISTANT Resistant     CEFEPIME <=0.12 SENSITIVE Sensitive     CEFTAZIDIME <=1 SENSITIVE Sensitive     CEFTRIAXONE <=0.25 SENSITIVE Sensitive     CIPROFLOXACIN <=0.25 SENSITIVE Sensitive     GENTAMICIN <=1 SENSITIVE Sensitive     IMIPENEM <=0.25 SENSITIVE Sensitive     TRIMETH/SULFA <=20 SENSITIVE Sensitive     AMPICILLIN/SULBACTAM >=32 RESISTANT Resistant     PIP/TAZO <=4 SENSITIVE Sensitive ug/mL    * KLEBSIELLA PNEUMONIAE    KLEBSIELLA PNEUMONIAE  Blood Culture (routine x 2)     Status: Abnormal   Collection Time: 08/13/23  9:44 PM   Specimen: BLOOD LEFT ARM  Result Value Ref Range Status   Specimen Description BLOOD LEFT ARM  Final   Special Requests   Final    BOTTLES DRAWN AEROBIC AND ANAEROBIC Blood Culture adequate volume   Culture  Setup Time   Final    GRAM NEGATIVE RODS AEROBIC BOTTLE ONLY CRITICAL VALUE NOTED.  VALUE IS CONSISTENT WITH PREVIOUSLY REPORTED AND CALLED VALUE.    Culture (A)  Final    KLEBSIELLA PNEUMONIAE SUSCEPTIBILITIES PERFORMED ON PREVIOUS CULTURE WITHIN THE LAST 5 DAYS. Performed at [facility], [address], [place], [place] [address]    Report Status 08/16/2023 FINAL  Final  Blood Culture ID Panel (Reflexed)     Status: Abnormal   Collection Time: 08/13/23  9:44 PM  Result Value Ref Range Status   Enterococcus faecalis NOT DETECTED NOT DETECTED Final   Enterococcus Faecium NOT DETECTED NOT DETECTED Final   Listeria monocytogenes NOT DETECTED NOT DETECTED Final   Staphylococcus species NOT DETECTED NOT DETECTED Final   Staphylococcus aureus (BCID) NOT DETECTED NOT DETECTED  Final   Staphylococcus epidermidis NOT DETECTED NOT DETECTED Final   Staphylococcus lugdunensis NOT DETECTED NOT DETECTED Final   Streptococcus species NOT DETECTED NOT DETECTED Final   Streptococcus agalactiae NOT DETECTED NOT DETECTED Final   Streptococcus pneumoniae NOT DETECTED NOT DETECTED Final   Streptococcus pyogenes NOT DETECTED NOT DETECTED Final   A.calcoaceticus-baumannii NOT DETECTED NOT DETECTED Final   Bacteroides fragilis NOT DETECTED NOT DETECTED Final  Enterobacterales DETECTED (A) NOT DETECTED Final    Comment: Enterobacterales represent a large order of gram negative bacteria, not a single organism. CRITICAL RESULT CALLED TO, READ BACK BY AND VERIFIED WITH: PHARMD JENNY Z ON [id number] @1158  BY SM    Enterobacter cloacae complex NOT DETECTED NOT DETECTED Final   Escherichia coli NOT DETECTED NOT DETECTED Final   Klebsiella aerogenes NOT DETECTED NOT DETECTED Final   Klebsiella oxytoca NOT DETECTED NOT DETECTED Final   Klebsiella pneumoniae DETECTED (A) NOT DETECTED Final    Comment: CRITICAL RESULT CALLED TO, READ BACK BY AND VERIFIED WITH: PHARMD JENNY Z ON [id number] @1158  BY SM    Proteus species NOT DETECTED NOT DETECTED Final   Salmonella species NOT DETECTED NOT DETECTED Final   Serratia marcescens NOT DETECTED NOT DETECTED Final   Haemophilus influenzae NOT DETECTED NOT DETECTED Final   Neisseria meningitidis NOT DETECTED NOT DETECTED Final   Pseudomonas aeruginosa NOT DETECTED NOT DETECTED Final   Stenotrophomonas maltophilia NOT DETECTED NOT DETECTED Final   Candida albicans NOT DETECTED NOT DETECTED Final   Candida auris NOT DETECTED NOT DETECTED Final   Candida glabrata NOT DETECTED NOT DETECTED Final   Candida krusei NOT DETECTED NOT DETECTED Final   Candida parapsilosis NOT DETECTED NOT DETECTED Final   Candida tropicalis NOT DETECTED NOT DETECTED Final   Cryptococcus neoformans/gattii NOT DETECTED NOT DETECTED Final   CTX-M ESBL NOT DETECTED NOT DETECTED  Final   Carbapenem resistance IMP NOT DETECTED NOT DETECTED Final   Carbapenem resistance KPC NOT DETECTED NOT DETECTED Final   Carbapenem resistance NDM NOT DETECTED NOT DETECTED Final   Carbapenem resist OXA 48 LIKE NOT DETECTED NOT DETECTED Final   Carbapenem resistance VIM NOT DETECTED NOT DETECTED Final    Comment: Performed at [facility], [address], [place], [place] [address]         Radiology Studies: DG ERCP Result Date: 08/16/2023 CLINICAL DATA:  Elective surgery.  ERCP.  Choledocholithiasis. EXAM: ERCP TECHNIQUE: Multiple spot images obtained with the fluoroscopic device and submitted for interpretation post-procedure. FLUOROSCOPY: Radiation Exposure Index (as provided by the fluoroscopic device): 68 mGy Kerma COMPARISON:  CT chest abdomen pelvis 08/14/2023 FINDINGS: Eighteen intraoperative fluoroscopic images were provided for interpretation. The submitted images demonstrate cannulation and opacification of the intra and extrahepatic bile ducts. The common bile duct appears mildly dilated. Multiple balloon sweeps are seen. No discrete filling defects are identified on the final images of the common bile duct. IMPRESSION: Intraoperative cholangiogram demonstrating multiple balloon sweeps of the common bile duct. These images were submitted for radiologic interpretation only. Please see the procedural report for the amount of contrast and the fluoroscopy time utilized. Electronically Signed   By: Acquanetta Belling M.D.   On: 08/16/2023 07:36        Scheduled Meds:  diclofenac  100 mg Rectal Once   escitalopram  20 mg Oral Daily   insulin aspart  0-9 Units Subcutaneous Q4H   metoprolol tartrate  25 mg Oral BID   mirtazapine  15 mg Oral QHS   sodium chloride flush  3 mL Intravenous Q12H   Continuous Infusions:  cefTRIAXone (ROCEPHIN)  IV 2 g (08/17/23 1007)     LOS: 3 days    Time spent: 35 minutes    Dorcas Carrow, MD [facility]

## 2023-08-17 NOTE — Progress Notes (Signed)
 1 Day Post-Op   Subjective/Chief Complaint: Patient sore, but feels better than before surgery No nausea or vomiting Passing flatus - no BM yet Only on liquids right now  CMP still pending   Objective: Vital signs in last 24 hours: Temp:  [97.4 F (36.3 C)-98.7 F (37.1 C)] 98.2 F (36.8 C) (03/02 0741) Pulse Rate:  [70-97] 71 (03/02 0741) Resp:  [18-22] 18 (03/02 0741) BP: (138-160)/(76-93) 150/86 (03/02 0741) SpO2:  [90 %-100 %] 100 % (03/02 0741) Last BM Date : 08/12/23 (per pt)  Intake/Output from previous day: 03/01 0701 - 03/02 0700 In: 800 [I.V.:700; IV Piggyback:100] Out: 350 [Urine:300; Blood:50] Intake/Output this shift: No intake/output data recorded.  WDWN in NAD Abd - obese, soft, incisional tenderness Umbilical incision - some staining on dressing Other incisions c/d/i  Lab Results:  Recent Labs    08/16/23 1201 08/17/23 0716  WBC 3.0* 3.9*  HGB 8.9* 8.8*  HCT 24.7* 25.2*  PLT 130* 147*   BMET Recent Labs    08/15/23 0752 08/16/23 1201  NA 138 135  K 3.3* 3.9  CL 105 106  CO2 17* 20*  GLUCOSE 113* 175*  BUN 22 23  CREATININE 1.57* 1.50*  CALCIUM 8.3* 8.0*   PT/INR No results for input(s): "LABPROT", "INR" in the last 72 hours. ABG No results for input(s): "PHART", "HCO3" in the last 72 hours.  Invalid input(s): "PCO2", "PO2"  Studies/Results: DG ERCP Result Date: 08/16/2023 CLINICAL DATA:  Elective surgery.  ERCP.  Choledocholithiasis. EXAM: ERCP TECHNIQUE: Multiple spot images obtained with the fluoroscopic device and submitted for interpretation post-procedure. FLUOROSCOPY: Radiation Exposure Index (as provided by the fluoroscopic device): 68 mGy Kerma COMPARISON:  CT chest abdomen pelvis 08/14/2023 FINDINGS: Eighteen intraoperative fluoroscopic images were provided for interpretation. The submitted images demonstrate cannulation and opacification of the intra and extrahepatic bile ducts. The common bile duct appears mildly dilated.  Multiple balloon sweeps are seen. No discrete filling defects are identified on the final images of the common bile duct. IMPRESSION: Intraoperative cholangiogram demonstrating multiple balloon sweeps of the common bile duct. These images were submitted for radiologic interpretation only. Please see the procedural report for the amount of contrast and the fluoroscopy time utilized. Electronically Signed   By: Acquanetta Belling M.D.   On: 08/16/2023 07:36    Anti-infectives: Anti-infectives (From admission, onward)    Start     Dose/Rate Route Frequency Ordered Stop   08/14/23 1000  cefTRIAXone (ROCEPHIN) 2 g in sodium chloride 0.9 % 100 mL IVPB        2 g 200 mL/hr over 30 Minutes Intravenous Every 24 hours 08/14/23 0416 08/21/23 0959   08/14/23 1000  metroNIDAZOLE (FLAGYL) IVPB 500 mg  Status:  Discontinued        500 mg 100 mL/hr over 60 Minutes Intravenous Every 12 hours 08/14/23 0416 08/15/23 1014   08/13/23 2200  ceFEPIme (MAXIPIME) 2 g in sodium chloride 0.9 % 100 mL IVPB        2 g 200 mL/hr over 30 Minutes Intravenous  Once 08/13/23 2146 08/13/23 2315   08/13/23 2200  metroNIDAZOLE (FLAGYL) IVPB 500 mg        500 mg 100 mL/hr over 60 Minutes Intravenous  Once 08/13/23 2146 08/14/23 0026   08/13/23 2200  vancomycin (VANCOCIN) IVPB 1000 mg/200 mL premix        1,000 mg 200 mL/hr over 60 Minutes Intravenous  Once 08/13/23 2146 08/14/23 0208       Assessment/Plan: Cholelithiasis  choledocholithiasis  Jaundice   - ERCP 08/15/23 - Dr. Marina Goodell - T. Bili - trending down - awaiting today's labs - Laparoscopic cholecystectomy/ laparoscopic lysis of adhesions 08/16/23 - Thai Burgueno.  The gallbladder was very thickened and distended.  Required extension of the umbilical incision to allow removal of the detached gallbladder.  He had significant adhesions of small bowel and omentum to the anterior abdominal wall due to the previous LAR.    FEN: Advance to soft diet ID: rocephin/flagyl - D/C abx  today VTE: okay for chemical ppx from surgical standpoint   - per primary- chronic hepatitis C HLD substance abuse HTN GERD\ GIB colorectal cancer s/p LAR   Discharge per primary team.  LOS: 3 days    [name] 08/17/2023

## 2023-08-17 NOTE — Discharge Instructions (Signed)
 [facility] ______[facility] LAPAROSCOPIC SURGERY: POST OP INSTRUCTIONS Always review your discharge instruction sheet given to you by the facility where your surgery was performed. IF YOU HAVE DISABILITY OR FAMILY LEAVE FORMS, YOU MUST BRING THEM TO THE OFFICE FOR PROCESSING.   DO NOT GIVE THEM TO YOUR DOCTOR.  A prescription for pain medication may be given to you upon discharge.  Take your pain medication as prescribed, if needed.  If narcotic pain medicine is not needed, then you may take acetaminophen (Tylenol) or ibuprofen (Advil) as needed. Take your usually prescribed medications unless otherwise directed. If you need a refill on your pain medication, please contact your pharmacy.  They will contact our office to request authorization. Prescriptions will not be filled after 5pm or on week-ends. You should follow a light diet the first few days after arrival home, such as soup and crackers, etc.  Be sure to include lots of fluids daily. Most patients will experience some swelling and bruising in the area of the incisions.  Ice packs will help.  Swelling and bruising can take several days to resolve.  It is common to experience some constipation if taking pain medication after surgery.  Increasing fluid intake and taking a stool softener (such as Colace) will usually help or prevent this problem from occurring.  A mild laxative (Milk of Magnesia or Miralax) should be taken according to package instructions if there are no bowel movements after 48 hours. Unless discharge instructions indicate otherwise, you may remove your bandages 24-48 hours after surgery, and you may shower at that time.  You may have steri-strips (small skin tapes) in place directly over the incision.  These strips should be left on the skin for 7-10 days.  If your surgeon used skin glue on the incision, you may shower in 24 hours.  The glue will flake off over the next 2-3 weeks.  Any sutures or staples will be  removed at the office during your follow-up visit. ACTIVITIES:  You may resume regular (light) daily activities beginning the next day--such as daily self-care, walking, climbing stairs--gradually increasing activities as tolerated.  You may have sexual intercourse when it is comfortable.  Refrain from any heavy lifting or straining until approved by your doctor. You may drive when you are no longer taking prescription pain medication, you can comfortably wear a seatbelt, and you can safely maneuver your car and apply brakes. RETURN TO WORK:  __________________________________________________________ [name] should see your doctor in the office for a follow-up appointment approximately 2-3 weeks after your surgery.  Make sure that you call for this appointment within a day or two after you arrive home to insure a convenient appointment time. OTHER INSTRUCTIONS: __________________________________________________________________________________________________________________________ __________________________________________________________________________________________________________________________ WHEN TO CALL YOUR DOCTOR: Fever over 101.0 Inability to urinate Continued bleeding from incision. Increased pain, redness, or drainage from the incision. Increasing abdominal pain  The clinic staff is available to answer your questions during regular business hours.  Please don't hesitate to call and ask to speak to one of the nurses for clinical concerns.  If you have a medical emergency, go to the nearest emergency room or call 911.  A surgeon from [facility] is always on call at the hospital. [address], [address], [place], [place]  [address] ? [address] [address], [place], [place]   [address] [phone] ? [phone] ? FAX [phone] Web site: [email or web address]

## 2023-08-18 ENCOUNTER — Encounter (HOSPITAL_COMMUNITY): Payer: Self-pay | Admitting: Internal Medicine

## 2023-08-18 DIAGNOSIS — K8042 Calculus of bile duct with acute cholecystitis without obstruction: Secondary | ICD-10-CM | POA: Diagnosis not present

## 2023-08-18 LAB — COMPREHENSIVE METABOLIC PANEL
ALT: 30 U/L (ref 0–44)
AST: 64 U/L — ABNORMAL HIGH (ref 15–41)
Albumin: 2.7 g/dL — ABNORMAL LOW (ref 3.5–5.0)
Alkaline Phosphatase: 216 U/L — ABNORMAL HIGH (ref 38–126)
Anion gap: 9 (ref 5–15)
BUN: 15 mg/dL (ref 8–23)
CO2: 21 mmol/L — ABNORMAL LOW (ref 22–32)
Calcium: 8.1 mg/dL — ABNORMAL LOW (ref 8.9–10.3)
Chloride: 106 mmol/L (ref 98–111)
Creatinine, Ser: 1.34 mg/dL — ABNORMAL HIGH (ref 0.61–1.24)
GFR, Estimated: 58 mL/min — ABNORMAL LOW (ref >60)
Glucose, Bld: 155 mg/dL — ABNORMAL HIGH (ref 70–99)
Potassium: 3.5 mmol/L (ref 3.5–5.1)
Sodium: 136 mmol/L (ref 135–145)
Total Bilirubin: 1.8 mg/dL — ABNORMAL HIGH (ref 0.0–1.2)
Total Protein: 6.1 g/dL — ABNORMAL LOW (ref 6.5–8.1)

## 2023-08-18 LAB — CBC
HCT: 27 % — ABNORMAL LOW (ref 39.0–52.0)
Hemoglobin: 9.2 g/dL — ABNORMAL LOW (ref 13.0–17.0)
MCH: 36.2 pg — ABNORMAL HIGH (ref 26.0–34.0)
MCHC: 34.1 g/dL (ref 30.0–36.0)
MCV: 106.3 fL — ABNORMAL HIGH (ref 80.0–100.0)
Platelets: 208 10*3/uL (ref 150–400)
RBC: 2.54 MIL/uL — ABNORMAL LOW (ref 4.22–5.81)
RDW: 18.1 % — ABNORMAL HIGH (ref 11.5–15.5)
WBC: 4.6 10*3/uL (ref 4.0–10.5)
nRBC: 0.4 % — ABNORMAL HIGH (ref 0.0–0.2)

## 2023-08-18 LAB — GLUCOSE, CAPILLARY
Glucose-Capillary: 150 mg/dL — ABNORMAL HIGH (ref 70–99)
Glucose-Capillary: 139 mg/dL — ABNORMAL HIGH (ref 70–99)
Glucose-Capillary: 185 mg/dL — ABNORMAL HIGH (ref 70–99)

## 2023-08-18 MED ORDER — LOPERAMIDE HCL 2 MG PO CAPS
2.0000 mg | ORAL_CAPSULE | Freq: Two times a day (BID) | ORAL | Status: DC | PRN
  Administered 2023-08-18: 2 mg via ORAL
  Filled 2023-08-18: qty 1

## 2023-08-18 MED ORDER — IOHEXOL 9 MG/ML PO SOLN: 500.0000 mL | ORAL | Status: DC

## 2023-08-18 NOTE — Progress Notes (Signed)
 2 Days Post-Op   Subjective/Chief Complaint: Complains of nausea with eating, worse ab pain, having a lot of diarrhea which was preop state   Objective: Vital signs in last 24 hours: Temp:  [98.1 F (36.7 C)-98.4 F (36.9 C)] 98.1 F (36.7 C) (03/03 0441) Pulse Rate:  [73-77] 73 (03/03 0441) Resp:  [17-18] 18 (03/03 0441) BP: (142-164)/(75-87) 164/87 (03/03 0441) SpO2:  [94 %-95 %] 94 % (03/03 0441) Last BM Date : 08/17/23  Intake/Output from previous day: No intake/output data recorded. Intake/Output this shift: No intake/output data recorded.  Ab incisions with dressings intact, approp tender diffusely mostly around umbilicus  Lab Results:  Recent Labs    08/16/23 1201 08/17/23 0716  WBC 3.0* 3.9*  HGB 8.9* 8.8*  HCT 24.7* 25.2*  PLT 130* 147*   BMET Recent Labs    08/16/23 1201 08/17/23 0716  NA 135 134*  K 3.9 3.7  CL 106 101  CO2 20* 20*  GLUCOSE 175* 132*  BUN 23 18  CREATININE 1.50* 1.45*  CALCIUM 8.0* 8.7*   PT/INR No results for input(s): "LABPROT", "INR" in the last 72 hours. ABG No results for input(s): "PHART", "HCO3" in the last 72 hours.  Invalid input(s): "PCO2", "PO2"  Studies/Results: No results found.  Anti-infectives: Anti-infectives (From admission, onward)    Start     Dose/Rate Route Frequency Ordered Stop   08/18/23 1000  levofloxacin (LEVAQUIN) tablet 750 mg        750 mg Oral Daily 08/17/23 1135 08/20/23 0959   08/18/23 0600  cephALEXin (KEFLEX) capsule 500 mg  Status:  Discontinued        500 mg Oral Every 8 hours 08/17/23 1117 08/17/23 1130   08/14/23 1000  cefTRIAXone (ROCEPHIN) 2 g in sodium chloride 0.9 % 100 mL IVPB  Status:  Discontinued        2 g 200 mL/hr over 30 Minutes Intravenous Every 24 hours 08/14/23 0416 08/17/23 1117   08/14/23 1000  metroNIDAZOLE (FLAGYL) IVPB 500 mg  Status:  Discontinued        500 mg 100 mL/hr over 60 Minutes Intravenous Every 12 hours 08/14/23 0416 08/15/23 1014   08/13/23 2200   ceFEPIme (MAXIPIME) 2 g in sodium chloride 0.9 % 100 mL IVPB        2 g 200 mL/hr over 30 Minutes Intravenous  Once 08/13/23 2146 08/13/23 2315   08/13/23 2200  metroNIDAZOLE (FLAGYL) IVPB 500 mg        500 mg 100 mL/hr over 60 Minutes Intravenous  Once 08/13/23 2146 08/14/23 0026   08/13/23 2200  vancomycin (VANCOCIN) IVPB 1000 mg/200 mL premix        1,000 mg 200 mL/hr over 60 Minutes Intravenous  Once 08/13/23 2146 08/14/23 0208       Assessment/Plan: Post ERCP day 3 POD 2 lap chole- MT -vitals ok , having bowel function, nausea with cheeseburge -having more ab pain than what would expect although I think this should be ok -will check ct today to rule out fluid collection -restart imodium -check cmet      FEN: soft diet ID: [email or web address] completed VTE: okay for chemical ppx from surgical standpoint   - per primary- chronic hepatitis C HLD substance abuse HTN GERD\ GIB colorectal cancer s/p LAR  Emelia Loron 08/18/2023

## 2023-08-18 NOTE — Discharge Summary (Signed)
 Physician Discharge Summary  [name] XBM:[id number] DOB: Aug 10, 1955 DOA: 08/13/2023  PCP: [facility]  Admit date: 08/13/2023 Discharge date: 08/18/2023  Admitted From: Home Disposition: Home  Recommendations for Outpatient Follow-up:  Follow up with PCP in 1-2 weeks Please obtain BMP/CBC in one week Surgery to schedule follow-up   Discharge Condition: Fair CODE STATUS: Full code Diet recommendation: Low-salt diet, bland food  Discharge summary: 68 year old with history of rectal adenocarcinoma treated with excision and chemo, insulin-dependent type 2 diabetes, chronic hepatitis C infection, GERD and hyperlipidemia presented with multiple episodes of vomiting and epigastric pain. He was tachycardic, temperature 101.7 with a stable blood pressures in the ER. On room air. CT scan consistent with cholelithiasis and cholecystitis. Bilirubin 5. MRCP positive for choledocholithiasis. Started on broad-spectrum antibiotics after drawing blood cultures. GI and surgery consulted.  Underwent ERCP 2/28 Underwent lap chole 3/1   Acute calculus cholecystitis with choledocholithiasis: Klebsiella pneumonia bacteremia Sepsis present on admission secondary to cholecystitis, Klebsiella bacteremia. ERCP with sphincterotomy 2/28 Lap chole 3/1 Tolerated soft diet.  He has chronic diarrhea.  Pain is controlled with oral pain medications. 3/3, complaint of distended abdomen without any nausea vomiting. Surgery has ordered for CT scan of the abdomen pelvis with contrast to rule out any intra-abdominal collection or complications, however patient declined and left AGAINST MEDICAL ADVICE. Source control achieved, completed 6 days of antibiotic therapy.  No further antibiotics needed.   Hypokalemia/hypomagnesemia: Replaced.  Improved.    Type 2 diabetes: On insulin at home.  Continue.   AKI on CKD stage IIIa: Baseline creatinine about 1.39.  He was treated with IV fluids and trending  down.     Neuropathic pain: On Robaxin and gabapentin.  Continue.    Postoperatively patient was complaining of abdominal distention and was advised to get a CT scan to look for any intra-abdominal complication.  Patient ultimately decided that this is not new for him.  He declined CT scan as ordered by surgery.  Patient decided to walk out of the hospital.  Patient was counseled thoroughly to look for any complications, severe pain, vomiting.  He is aware. He was advised to continue and resume all home medications. Patient was prescribed short course of opiates for pain relief. He was educated to come back to the hospital including monitoring for abdominal pain or distention. He was scheduled follow-up with surgery.  Weight lifting restrictions for 2 weeks.  Left AGAINST MEDICAL ADVICE.   Discharge Diagnoses:  Principal Problem:   Choledocholithiasis with acute cholecystitis Active Problems:   Sepsis (HCC)   Acute cholecystitis   Chronic hepatitis C virus infection (HCC)   Hyperlipidemia   GERD (gastroesophageal reflux disease)   Insulin dependent type 2 diabetes mellitus (HCC)   Chronic tachycardia   GAD (generalized anxiety disorder)   Insomnia   Neuropathic pain   Hypokalemia   AKI (acute kidney injury) (HCC)   Metabolic acidosis    Discharge Instructions  resume home meds    Follow-up Information     Maczis, Puja Gosai, PA-C. Call.   Specialty: General Surgery Why: We are making a follow up appointment for you., Please call to confirm appointment time., Arrive 30 minutes early to complete check in, and bring photo ID and insurance card. Contact information: [address] [facility] [place] [address] [phone]                Allergies  Allergen Reactions   Spironolactone Other (See Comments)  Added to [facility] records     Consultations: General Surgery   Procedures/Studies: DG ERCP Result Date:  08/16/2023 CLINICAL DATA:  Elective surgery.  ERCP.  Choledocholithiasis. EXAM: ERCP TECHNIQUE: Multiple spot images obtained with the fluoroscopic device and submitted for interpretation post-procedure. FLUOROSCOPY: Radiation Exposure Index (as provided by the fluoroscopic device): 68 mGy Kerma COMPARISON:  CT chest abdomen pelvis 08/14/2023 FINDINGS: Eighteen intraoperative fluoroscopic images were provided for interpretation. The submitted images demonstrate cannulation and opacification of the intra and extrahepatic bile ducts. The common bile duct appears mildly dilated. Multiple balloon sweeps are seen. No discrete filling defects are identified on the final images of the common bile duct. IMPRESSION: Intraoperative cholangiogram demonstrating multiple balloon sweeps of the common bile duct. These images were submitted for radiologic interpretation only. Please see the procedural report for the amount of contrast and the fluoroscopy time utilized. Electronically Signed   By: Acquanetta Belling M.D.   On: 08/16/2023 07:36   MR ABDOMEN MRCP W WO CONTAST Result Date: 08/14/2023 CLINICAL DATA:  Cholelithiasis Elevated bilirubin; Cholelithiasis 175970 Biliary obstruction 175970 Elevated bilirubin EXAM: MRI ABDOMEN WITHOUT AND WITH CONTRAST (INCLUDING MRCP) TECHNIQUE: Multiplanar multisequence MR imaging of the abdomen was performed both before and after the administration of intravenous contrast. Heavily T2-weighted images of the biliary and pancreatic ducts were obtained, and three-dimensional MRCP images were rendered by post processing. CONTRAST:  10mL GADAVIST GADOBUTROL 1 MMOL/ML IV SOLN COMPARISON:  CT scan abdomen and pelvis from earlier the same day. FINDINGS: Lower chest: Unremarkable MR appearance to the lung bases. No pleural effusion. No pericardial effusion. Normal heart size. Hepatobiliary: The liver is normal in size and configuration. No intra or extrahepatic bile duct dilation. However, there are at  least 3, sub 5 mm calculi in the cystic duct (series 14, images 23-25) and at least 2, sub 5 mm calculi in the distal common bile duct. The gallbladder is physiologically distended. There is focal fundal adenomyomatosis. There are small volume multiple sub 5 mm dependent gallstones. No abnormal gallbladder wall thickening or pericholecystic inflammatory changes. Pancreas: No mass, inflammatory changes or other parenchymal abnormality identified. No main pancreatic duct dilation. Spleen:  Within normal limits in size and appearance. No focal mass. Adrenals/Urinary Tract: Unremarkable adrenal glands. There are multiple bilateral simple renal cysts with largest arising from the left kidney upper pole, posteriorly measuring up to 3.5 x 5.1 cm. No hydroureteronephrosis on either side. Stomach/Bowel: Visualized portions within the abdomen are unremarkable. No disproportionate dilation of bowel loops. Vascular/Lymphatic: No pathologically enlarged lymph nodes identified. No abdominal aortic aneurysm demonstrated. No ascites. Other:  None. Musculoskeletal: No suspicious bone lesions identified. IMPRESSION: 1. There are several, sub 5 mm calculi in the cystic duct and distal common bile duct. No intra or extrahepatic bile duct dilation. Multiple small volume gallstones without imaging signs of acute cholecystitis. 2. Multiple other nonacute observations, as described above. Electronically Signed   By: Jules Schick M.D.   On: 08/14/2023 08:33   MR 3D Recon At Scanner Result Date: 08/14/2023 CLINICAL DATA:  Cholelithiasis Elevated bilirubin; Cholelithiasis 175970 Biliary obstruction 175970 Elevated bilirubin EXAM: MRI ABDOMEN WITHOUT AND WITH CONTRAST (INCLUDING MRCP) TECHNIQUE: Multiplanar multisequence MR imaging of the abdomen was performed both before and after the administration of intravenous contrast. Heavily T2-weighted images of the biliary and pancreatic ducts were obtained, and three-dimensional MRCP images  were rendered by post processing. CONTRAST:  10mL GADAVIST GADOBUTROL 1 MMOL/ML IV SOLN COMPARISON:  CT scan abdomen and pelvis from earlier the  same day. FINDINGS: Lower chest: Unremarkable MR appearance to the lung bases. No pleural effusion. No pericardial effusion. Normal heart size. Hepatobiliary: The liver is normal in size and configuration. No intra or extrahepatic bile duct dilation. However, there are at least 3, sub 5 mm calculi in the cystic duct (series 14, images 23-25) and at least 2, sub 5 mm calculi in the distal common bile duct. The gallbladder is physiologically distended. There is focal fundal adenomyomatosis. There are small volume multiple sub 5 mm dependent gallstones. No abnormal gallbladder wall thickening or pericholecystic inflammatory changes. Pancreas: No mass, inflammatory changes or other parenchymal abnormality identified. No main pancreatic duct dilation. Spleen:  Within normal limits in size and appearance. No focal mass. Adrenals/Urinary Tract: Unremarkable adrenal glands. There are multiple bilateral simple renal cysts with largest arising from the left kidney upper pole, posteriorly measuring up to 3.5 x 5.1 cm. No hydroureteronephrosis on either side. Stomach/Bowel: Visualized portions within the abdomen are unremarkable. No disproportionate dilation of bowel loops. Vascular/Lymphatic: No pathologically enlarged lymph nodes identified. No abdominal aortic aneurysm demonstrated. No ascites. Other:  None. Musculoskeletal: No suspicious bone lesions identified. IMPRESSION: 1. There are several, sub 5 mm calculi in the cystic duct and distal common bile duct. No intra or extrahepatic bile duct dilation. Multiple small volume gallstones without imaging signs of acute cholecystitis. 2. Multiple other nonacute observations, as described above. Electronically Signed   By: Jules Schick M.D.   On: 08/14/2023 08:33   CT CHEST ABDOMEN PELVIS W CONTRAST Result Date: 08/14/2023 CLINICAL  DATA:  Sepsis, shortness of breath, abdominal pain, vomiting EXAM: CT CHEST, ABDOMEN, AND PELVIS WITH CONTRAST TECHNIQUE: Multidetector CT imaging of the chest, abdomen and pelvis was performed following the standard protocol during bolus administration of intravenous contrast. RADIATION DOSE REDUCTION: This exam was performed according to the departmental dose-optimization program which includes automated exposure control, adjustment of the mA and/or kV according to patient size and/or use of iterative reconstruction technique. CONTRAST:  75mL OMNIPAQUE IOHEXOL 350 MG/ML SOLN COMPARISON:  1120 set FINDINGS: CT CHEST FINDINGS Cardiovascular: Heart is normal size. Aorta is normal caliber. No filling defects in the pulmonary arteries to suggest pulmonary emboli. Scattered coronary artery and aortic calcifications. Mediastinum/Nodes: No mediastinal, hilar, or axillary adenopathy. Trachea and esophagus are unremarkable. Thyroid unremarkable. Lungs/Pleura: Dependent atelectasis in the lower lobes. No effusions. Musculoskeletal: Chest wall soft tissues are unremarkable. No acute bony abnormality. CT ABDOMEN PELVIS FINDINGS Hepatobiliary: Multiple gallstones within the gallbladder. A few small stones noted in the distal common bile duct. Gallbladder wall appears thickened. No biliary ductal dilatation. Pancreas: No focal abnormality or ductal dilatation. Spleen: No focal abnormality.  Normal size. Adrenals/Urinary Tract: Bilateral renal benign cysts. No follow-up imaging recommended. No stones or hydronephrosis. Urinary bladder and adrenal glands unremarkable. Stomach/Bowel: There is mild wall thickening within the rectum with surrounding inflammatory stranding suggesting proctitis. Colonic diverticulosis. No active diverticulitis. Appendix normal. Stomach and small bowel decompressed. No bowel obstruction. Vascular/Lymphatic: Aortic atherosclerosis. No evidence of aneurysm or adenopathy. Reproductive: No visible focal  abnormality. Other: No free fluid or free air. Musculoskeletal: No acute bony abnormality. IMPRESSION: Cholelithiasis. A few small stones noted within the cystic duct and common bile duct. Mild gallbladder wall thickening. Findings concerning for possible acute cholecystitis. Rectal wall thickening with surrounding inflammation concerning for proctitis. Colonic diverticulosis. Dependent atelectasis in the lower lobes. Coronary artery disease, aortic atherosclerosis. Electronically Signed   By: Charlett Nose M.D.   On: 08/14/2023 01:21   DG Chest  2 View Result Date: 08/13/2023 CLINICAL DATA:  Short of breath, epigastric pain, vomiting EXAM: CHEST - 2 VIEW COMPARISON:  06/28/2005 FINDINGS: The heart size and mediastinal contours are within normal limits. Both lungs are clear. The visualized skeletal structures are unremarkable. IMPRESSION: No active cardiopulmonary disease. Electronically Signed   By: Sharlet Salina M.D.   On: 08/13/2023 19:37   (Echo, Carotid, EGD, Colonoscopy, ERCP)    Subjective: Patient seen in the morning rounds.  Goes to bathroom frequently and that is nothing new for him.  Complaint of abdominal distention.  Later on the afternoon, he decided to walk out out of the hospital stating he is doing fine, wants to go home and relax.  Declined to have CT scan.   Discharge Exam: Vitals:   08/18/23 0441 08/18/23 0756  BP: (!) 164/87 (!) 163/86  Pulse: 73 80  Resp: 18 18  Temp: 98.1 F (36.7 C) 98.9 F (37.2 C)  SpO2: 94% 95%   Vitals:   08/17/23 0741 08/17/23 2031 08/18/23 0441 08/18/23 0756  BP: (!) 150/86 (!) 142/75 (!) 164/87 (!) 163/86  Pulse: 71 77 73 80  Resp: 18 17 18 18   Temp: 98.2 F (36.8 C) 98.4 F (36.9 C) 98.1 F (36.7 C) 98.9 F (37.2 C)  TempSrc: Oral     SpO2: 100% 95% 94% 95%  Weight:      Height:        General: Pt is alert, awake, not in acute distress Cardiovascular: RRR, S1/S2 +, no rubs, no gallops Respiratory: CTA bilaterally, no wheezing, no  rhonchi Abdominal: Soft, mildly tender around the ports.  Distended.  No rigidity or guarding.  Bowel sound present. Extremities: no edema, no cyanosis    The results of significant diagnostics from this hospitalization (including imaging, microbiology, ancillary and laboratory) are listed below for reference.     Microbiology: Recent Results (from the past 240 hours)  Resp panel by RT-PCR (RSV, Flu A&B, Covid) Anterior Nasal Swab     Status: None   Collection Time: 08/13/23  9:44 PM   Specimen: Anterior Nasal Swab  Result Value Ref Range Status   SARS Coronavirus 2 by RT PCR NEGATIVE NEGATIVE Final   Influenza A by PCR NEGATIVE NEGATIVE Final   Influenza B by PCR NEGATIVE NEGATIVE Final    Comment: (NOTE) The Xpert Xpress SARS-CoV-2/FLU/RSV plus assay is intended as an aid in the diagnosis of influenza from Nasopharyngeal swab specimens and should not be used as a sole basis for treatment. Nasal washings and aspirates are unacceptable for Xpert Xpress SARS-CoV-2/FLU/RSV testing.  Fact Sheet for Patients: BloggerCourse.com  Fact Sheet for Healthcare Providers: SeriousBroker.it  This test is not yet approved or cleared by the [place] FDA and has been authorized for detection and/or diagnosis of SARS-CoV-2 by FDA under an Emergency Use Authorization (EUA). This EUA will remain in effect (meaning this test can be used) for the duration of the COVID-19 declaration under Section 564(b)(1) of the Act, 21 U.S.C. section 360bbb-3(b)(1), unless the authorization is terminated or revoked.     Resp Syncytial Virus by PCR NEGATIVE NEGATIVE Final    Comment: (NOTE) Fact Sheet for Patients: BloggerCourse.com  Fact Sheet for Healthcare Providers: SeriousBroker.it  This test is not yet approved or cleared by the [place] FDA and has been authorized for detection and/or  diagnosis of SARS-CoV-2 by FDA under an Emergency Use Authorization (EUA). This EUA will remain in effect (meaning this test can be used) for the duration  of the COVID-19 declaration under Section 564(b)(1) of the Act, 21 U.S.C. section 360bbb-3(b)(1), unless the authorization is terminated or revoked.  Performed at [facility], [address], [place], [place] [address]   Blood Culture (routine x 2)     Status: Abnormal   Collection Time: 08/13/23  9:44 PM   Specimen: BLOOD LEFT ARM  Result Value Ref Range Status   Specimen Description BLOOD LEFT ARM  Final   Special Requests   Final    BOTTLES DRAWN AEROBIC AND ANAEROBIC Blood Culture results may not be optimal due to an inadequate volume of blood received in culture bottles   Culture  Setup Time   Final    GRAM NEGATIVE RODS IN BOTH AEROBIC AND ANAEROBIC BOTTLES CRITICAL RESULT CALLED TO, READ BACK BY AND VERIFIED WITH: PHARMD JENNY ON [id number] @1158  BY SM Performed at [facility], [address] [place], [place] [address]    Culture KLEBSIELLA PNEUMONIAE (A)  Final   Report Status 08/16/2023 FINAL  Final   Organism ID, Bacteria KLEBSIELLA PNEUMONIAE  Final   Organism ID, Bacteria KLEBSIELLA PNEUMONIAE  Final      Susceptibility   Klebsiella pneumoniae - KIRBY BAUER*    CEFAZOLIN RESISTANT Resistant    Klebsiella pneumoniae - MIC*    AMPICILLIN >=32 RESISTANT Resistant     CEFEPIME <=0.12 SENSITIVE Sensitive     CEFTAZIDIME <=1 SENSITIVE Sensitive     CEFTRIAXONE <=0.25 SENSITIVE Sensitive     CIPROFLOXACIN <=0.25 SENSITIVE Sensitive     GENTAMICIN <=1 SENSITIVE Sensitive     IMIPENEM <=0.25 SENSITIVE Sensitive     TRIMETH/SULFA <=20 SENSITIVE Sensitive     AMPICILLIN/SULBACTAM >=32 RESISTANT Resistant     PIP/TAZO <=4 SENSITIVE Sensitive ug/mL    * KLEBSIELLA PNEUMONIAE    KLEBSIELLA PNEUMONIAE  Blood Culture (routine x 2)     Status: Abnormal   Collection Time: 08/13/23  9:44 PM   Specimen: BLOOD LEFT  ARM  Result Value Ref Range Status   Specimen Description BLOOD LEFT ARM  Final   Special Requests   Final    BOTTLES DRAWN AEROBIC AND ANAEROBIC Blood Culture adequate volume   Culture  Setup Time   Final    GRAM NEGATIVE RODS AEROBIC BOTTLE ONLY CRITICAL VALUE NOTED.  VALUE IS CONSISTENT WITH PREVIOUSLY REPORTED AND CALLED VALUE.    Culture (A)  Final    KLEBSIELLA PNEUMONIAE SUSCEPTIBILITIES PERFORMED ON PREVIOUS CULTURE WITHIN THE LAST 5 DAYS. Performed at [facility], [address], [place], [place] [address]    Report Status 08/16/2023 FINAL  Final  Blood Culture ID Panel (Reflexed)     Status: Abnormal   Collection Time: 08/13/23  9:44 PM  Result Value Ref Range Status   Enterococcus faecalis NOT DETECTED NOT DETECTED Final   Enterococcus Faecium NOT DETECTED NOT DETECTED Final   Listeria monocytogenes NOT DETECTED NOT DETECTED Final   Staphylococcus species NOT DETECTED NOT DETECTED Final   Staphylococcus aureus (BCID) NOT DETECTED NOT DETECTED Final   Staphylococcus epidermidis NOT DETECTED NOT DETECTED Final   Staphylococcus lugdunensis NOT DETECTED NOT DETECTED Final   Streptococcus species NOT DETECTED NOT DETECTED Final   Streptococcus agalactiae NOT DETECTED NOT DETECTED Final   Streptococcus pneumoniae NOT DETECTED NOT DETECTED Final   Streptococcus pyogenes NOT DETECTED NOT DETECTED Final   A.calcoaceticus-baumannii NOT DETECTED NOT DETECTED Final   Bacteroides fragilis NOT DETECTED NOT DETECTED Final   Enterobacterales DETECTED (A) NOT DETECTED Final  Comment: Enterobacterales represent a large order of gram negative bacteria, not a single organism. CRITICAL RESULT CALLED TO, READ BACK BY AND VERIFIED WITH: PHARMD JENNY Z ON [id number] @1158  BY SM    Enterobacter cloacae complex NOT DETECTED NOT DETECTED Final   Escherichia coli NOT DETECTED NOT DETECTED Final   Klebsiella aerogenes NOT DETECTED NOT DETECTED Final   Klebsiella oxytoca NOT DETECTED NOT  DETECTED Final   Klebsiella pneumoniae DETECTED (A) NOT DETECTED Final    Comment: CRITICAL RESULT CALLED TO, READ BACK BY AND VERIFIED WITH: PHARMD JENNY Z ON [id number] @1158  BY SM    Proteus species NOT DETECTED NOT DETECTED Final   Salmonella species NOT DETECTED NOT DETECTED Final   Serratia marcescens NOT DETECTED NOT DETECTED Final   Haemophilus influenzae NOT DETECTED NOT DETECTED Final   Neisseria meningitidis NOT DETECTED NOT DETECTED Final   Pseudomonas aeruginosa NOT DETECTED NOT DETECTED Final   Stenotrophomonas maltophilia NOT DETECTED NOT DETECTED Final   Candida albicans NOT DETECTED NOT DETECTED Final   Candida auris NOT DETECTED NOT DETECTED Final   Candida glabrata NOT DETECTED NOT DETECTED Final   Candida krusei NOT DETECTED NOT DETECTED Final   Candida parapsilosis NOT DETECTED NOT DETECTED Final   Candida tropicalis NOT DETECTED NOT DETECTED Final   Cryptococcus neoformans/gattii NOT DETECTED NOT DETECTED Final   CTX-M ESBL NOT DETECTED NOT DETECTED Final   Carbapenem resistance IMP NOT DETECTED NOT DETECTED Final   Carbapenem resistance KPC NOT DETECTED NOT DETECTED Final   Carbapenem resistance NDM NOT DETECTED NOT DETECTED Final   Carbapenem resist OXA 48 LIKE NOT DETECTED NOT DETECTED Final   Carbapenem resistance VIM NOT DETECTED NOT DETECTED Final    Comment: Performed at [facility], [address] [place], [place] [address]     Labs: BNP (last 3 results) No results for input(s): "BNP" in the last 8760 hours. Basic Metabolic Panel: Recent Labs  Lab 08/14/23 0550 08/15/23 0752 08/16/23 1201 08/17/23 0716 08/18/23 0718  NA 133* 138 135 134* 136  K 4.0 3.3* 3.9 3.7 3.5  CL 100 105 106 101 106  CO2 18* 17* 20* 20* 21*  GLUCOSE 228* 113* 175* 132* 155*  BUN 21 22 23 18 15   CREATININE 1.92* 1.57* 1.50* 1.45* 1.34*  CALCIUM 8.2* 8.3* 8.0* 8.7* 8.1*  MG  --  1.5* 2.0  --   --   PHOS  --  2.7  --   --   --    Liver Function Tests: Recent Labs   Lab 08/14/23 0550 08/15/23 0752 08/16/23 1201 08/17/23 0716 08/18/23 0718  AST 107* 41 72* 98* 64*  ALT 38 24 24 31 30   ALKPHOS 92 67 128* 178* 216*  BILITOT 8.6* 5.7* 4.7* 3.1* 1.8*  PROT 6.5 5.9* 5.9* 5.8* 6.1*  ALBUMIN 3.2* 2.6* 2.5* 2.6* 2.7*   Recent Labs  Lab 08/13/23 2144  LIPASE 29   No results for input(s): "AMMONIA" in the last 168 hours. CBC: Recent Labs  Lab 08/14/23 0222 08/14/23 0550 08/15/23 0752 08/16/23 1201 08/17/23 0716 08/18/23 0718  WBC 4.0 5.6 3.9* 3.0* 3.9* 4.6  NEUTROABS 3.9  --   --   --   --   --   HGB 9.4* 9.9* 8.9* 8.9* 8.8* 9.2*  HCT 26.9* 28.1* 24.8* 24.7* 25.2* 27.0*  MCV 105.5* 105.2* 103.3* 103.8* 103.7* 106.3*  PLT 164 167 127* 130* 147* 208   Cardiac Enzymes: No results for input(s): "CKTOTAL", "CKMB", "CKMBINDEX", "TROPONINI" in  the last 168 hours. BNP: Invalid input(s): "POCBNP" CBG: Recent Labs  Lab 08/17/23 1633 08/17/23 2217 08/18/23 0525 08/18/23 0617 08/18/23 0754  GLUCAP 162* 188* 150* 139* 185*   D-Dimer No results for input(s): "DDIMER" in the last 72 hours. Hgb A1c No results for input(s): "HGBA1C" in the last 72 hours. Lipid Profile No results for input(s): "CHOL", "HDL", "LDLCALC", "TRIG", "CHOLHDL", "LDLDIRECT" in the last 72 hours. Thyroid function studies No results for input(s): "TSH", "T4TOTAL", "T3FREE", "THYROIDAB" in the last 72 hours.  Invalid input(s): "FREET3" Anemia work up No results for input(s): "VITAMINB12", "FOLATE", "FERRITIN", "TIBC", "IRON", "RETICCTPCT" in the last 72 hours. Urinalysis No results found for: "COLORURINE", "APPEARANCEUR", "LABSPEC", "PHURINE", "GLUCOSEU", "HGBUR", "BILIRUBINUR", "KETONESUR", "PROTEINUR", "UROBILINOGEN", "NITRITE", "LEUKOCYTESUR" Sepsis Labs Recent Labs  Lab 08/15/23 0752 08/16/23 1201 08/17/23 0716 08/18/23 0718  WBC 3.9* 3.0* 3.9* 4.6   Microbiology Recent Results (from the past 240 hours)  Resp panel by RT-PCR (RSV, Flu A&B, Covid) Anterior  Nasal Swab     Status: None   Collection Time: 08/13/23  9:44 PM   Specimen: Anterior Nasal Swab  Result Value Ref Range Status   SARS Coronavirus 2 by RT PCR NEGATIVE NEGATIVE Final   Influenza A by PCR NEGATIVE NEGATIVE Final   Influenza B by PCR NEGATIVE NEGATIVE Final    Comment: (NOTE) The Xpert Xpress SARS-CoV-2/FLU/RSV plus assay is intended as an aid in the diagnosis of influenza from Nasopharyngeal swab specimens and should not be used as a sole basis for treatment. Nasal washings and aspirates are unacceptable for Xpert Xpress SARS-CoV-2/FLU/RSV testing.  Fact Sheet for Patients: BloggerCourse.com  Fact Sheet for Healthcare Providers: SeriousBroker.it  This test is not yet approved or cleared by the [place] FDA and has been authorized for detection and/or diagnosis of SARS-CoV-2 by FDA under an Emergency Use Authorization (EUA). This EUA will remain in effect (meaning this test can be used) for the duration of the COVID-19 declaration under Section 564(b)(1) of the Act, 21 U.S.C. section 360bbb-3(b)(1), unless the authorization is terminated or revoked.     Resp Syncytial Virus by PCR NEGATIVE NEGATIVE Final    Comment: (NOTE) Fact Sheet for Patients: BloggerCourse.com  Fact Sheet for Healthcare Providers: SeriousBroker.it  This test is not yet approved or cleared by the [place] FDA and has been authorized for detection and/or diagnosis of SARS-CoV-2 by FDA under an Emergency Use Authorization (EUA). This EUA will remain in effect (meaning this test can be used) for the duration of the COVID-19 declaration under Section 564(b)(1) of the Act, 21 U.S.C. section 360bbb-3(b)(1), unless the authorization is terminated or revoked.  Performed at [facility], [address], [place], [place] [address]   Blood Culture (routine x 2)     Status: Abnormal    Collection Time: 08/13/23  9:44 PM   Specimen: BLOOD LEFT ARM  Result Value Ref Range Status   Specimen Description BLOOD LEFT ARM  Final   Special Requests   Final    BOTTLES DRAWN AEROBIC AND ANAEROBIC Blood Culture results may not be optimal due to an inadequate volume of blood received in culture bottles   Culture  Setup Time   Final    GRAM NEGATIVE RODS IN BOTH AEROBIC AND ANAEROBIC BOTTLES CRITICAL RESULT CALLED TO, READ BACK BY AND VERIFIED WITH: PHARMD JENNY ON [id number] @1158  BY SM Performed at [facility], [address], [place], [place] [address]    Culture KLEBSIELLA PNEUMONIAE (A)  Final  Report Status 08/16/2023 FINAL  Final   Organism ID, Bacteria KLEBSIELLA PNEUMONIAE  Final   Organism ID, Bacteria KLEBSIELLA PNEUMONIAE  Final      Susceptibility   Klebsiella pneumoniae - KIRBY BAUER*    CEFAZOLIN RESISTANT Resistant    Klebsiella pneumoniae - MIC*    AMPICILLIN >=32 RESISTANT Resistant     CEFEPIME <=0.12 SENSITIVE Sensitive     CEFTAZIDIME <=1 SENSITIVE Sensitive     CEFTRIAXONE <=0.25 SENSITIVE Sensitive     CIPROFLOXACIN <=0.25 SENSITIVE Sensitive     GENTAMICIN <=1 SENSITIVE Sensitive     IMIPENEM <=0.25 SENSITIVE Sensitive     TRIMETH/SULFA <=20 SENSITIVE Sensitive     AMPICILLIN/SULBACTAM >=32 RESISTANT Resistant     PIP/TAZO <=4 SENSITIVE Sensitive ug/mL    * KLEBSIELLA PNEUMONIAE    KLEBSIELLA PNEUMONIAE  Blood Culture (routine x 2)     Status: Abnormal   Collection Time: 08/13/23  9:44 PM   Specimen: BLOOD LEFT ARM  Result Value Ref Range Status   Specimen Description BLOOD LEFT ARM  Final   Special Requests   Final    BOTTLES DRAWN AEROBIC AND ANAEROBIC Blood Culture adequate volume   Culture  Setup Time   Final    GRAM NEGATIVE RODS AEROBIC BOTTLE ONLY CRITICAL VALUE NOTED.  VALUE IS CONSISTENT WITH PREVIOUSLY REPORTED AND CALLED VALUE.    Culture (A)  Final    KLEBSIELLA PNEUMONIAE SUSCEPTIBILITIES PERFORMED ON PREVIOUS CULTURE  WITHIN THE LAST 5 DAYS. Performed at [facility], [address] [place], [place] [address]    Report Status 08/16/2023 FINAL  Final  Blood Culture ID Panel (Reflexed)     Status: Abnormal   Collection Time: 08/13/23  9:44 PM  Result Value Ref Range Status   Enterococcus faecalis NOT DETECTED NOT DETECTED Final   Enterococcus Faecium NOT DETECTED NOT DETECTED Final   Listeria monocytogenes NOT DETECTED NOT DETECTED Final   Staphylococcus species NOT DETECTED NOT DETECTED Final   Staphylococcus aureus (BCID) NOT DETECTED NOT DETECTED Final   Staphylococcus epidermidis NOT DETECTED NOT DETECTED Final   Staphylococcus lugdunensis NOT DETECTED NOT DETECTED Final   Streptococcus species NOT DETECTED NOT DETECTED Final   Streptococcus agalactiae NOT DETECTED NOT DETECTED Final   Streptococcus pneumoniae NOT DETECTED NOT DETECTED Final   Streptococcus pyogenes NOT DETECTED NOT DETECTED Final   A.calcoaceticus-baumannii NOT DETECTED NOT DETECTED Final   Bacteroides fragilis NOT DETECTED NOT DETECTED Final   Enterobacterales DETECTED (A) NOT DETECTED Final    Comment: Enterobacterales represent a large order of gram negative bacteria, not a single organism. CRITICAL RESULT CALLED TO, READ BACK BY AND VERIFIED WITH: PHARMD JENNY Z ON [id number] @1158  BY SM    Enterobacter cloacae complex NOT DETECTED NOT DETECTED Final   Escherichia coli NOT DETECTED NOT DETECTED Final   Klebsiella aerogenes NOT DETECTED NOT DETECTED Final   Klebsiella oxytoca NOT DETECTED NOT DETECTED Final   Klebsiella pneumoniae DETECTED (A) NOT DETECTED Final    Comment: CRITICAL RESULT CALLED TO, READ BACK BY AND VERIFIED WITH: PHARMD JENNY Z ON [id number] @1158  BY SM    Proteus species NOT DETECTED NOT DETECTED Final   Salmonella species NOT DETECTED NOT DETECTED Final   Serratia marcescens NOT DETECTED NOT DETECTED Final   Haemophilus influenzae NOT DETECTED NOT DETECTED Final   Neisseria meningitidis NOT DETECTED  NOT DETECTED Final   Pseudomonas aeruginosa NOT DETECTED NOT DETECTED Final   Stenotrophomonas maltophilia NOT DETECTED NOT DETECTED Final   Candida  albicans NOT DETECTED NOT DETECTED Final   Candida auris NOT DETECTED NOT DETECTED Final   Candida glabrata NOT DETECTED NOT DETECTED Final   Candida krusei NOT DETECTED NOT DETECTED Final   Candida parapsilosis NOT DETECTED NOT DETECTED Final   Candida tropicalis NOT DETECTED NOT DETECTED Final   Cryptococcus neoformans/gattii NOT DETECTED NOT DETECTED Final   CTX-M ESBL NOT DETECTED NOT DETECTED Final   Carbapenem resistance IMP NOT DETECTED NOT DETECTED Final   Carbapenem resistance KPC NOT DETECTED NOT DETECTED Final   Carbapenem resistance NDM NOT DETECTED NOT DETECTED Final   Carbapenem resist OXA 48 LIKE NOT DETECTED NOT DETECTED Final   Carbapenem resistance VIM NOT DETECTED NOT DETECTED Final    Comment: Performed at [facility], [address], [place], [place] [address]     Time coordinating discharge: 35 minutes  SIGNED:   Dorcas Carrow, MD  [facility] 08/18/2023, 11:48 AM

## 2023-08-18 NOTE — Plan of Care (Signed)

## 2023-08-18 NOTE — Progress Notes (Signed)
 Patient refused CT, signed AMA form despite thorough education from RN X2. MD aware.

## 2023-08-19 LAB — SURGICAL PATHOLOGY
